# Patient Record
Sex: Female | Born: 1965 | Race: Black or African American | Hispanic: No | Marital: Single | Smoking: Never smoker
Health system: Southern US, Community
[De-identification: ages and names within clinical notes are randomized; demographics above are authoritative.]

## PROBLEM LIST (undated history)

## (undated) DIAGNOSIS — D259 Leiomyoma of uterus, unspecified: Secondary | ICD-10-CM

## (undated) DIAGNOSIS — Z87442 Personal history of urinary calculi: Secondary | ICD-10-CM

## (undated) DIAGNOSIS — K449 Diaphragmatic hernia without obstruction or gangrene: Secondary | ICD-10-CM

## (undated) DIAGNOSIS — K219 Gastro-esophageal reflux disease without esophagitis: Secondary | ICD-10-CM

## (undated) DIAGNOSIS — K5909 Other constipation: Secondary | ICD-10-CM

## (undated) DIAGNOSIS — K5732 Diverticulitis of large intestine without perforation or abscess without bleeding: Secondary | ICD-10-CM

## (undated) DIAGNOSIS — M1990 Unspecified osteoarthritis, unspecified site: Secondary | ICD-10-CM

## (undated) DIAGNOSIS — N920 Excessive and frequent menstruation with regular cycle: Secondary | ICD-10-CM

## (undated) HISTORY — PX: LAPAROSCOPIC CHOLECYSTECTOMY: SUR755

## (undated) HISTORY — PX: APPENDECTOMY: SHX54

## (undated) HISTORY — PX: OTHER SURGICAL HISTORY: SHX169

## (undated) HISTORY — DX: Gastro-esophageal reflux disease without esophagitis: K21.9

---

## 1998-03-16 ENCOUNTER — Emergency Department (HOSPITAL_COMMUNITY): Admission: EM | Admit: 1998-03-16 | Discharge: 1998-03-17 | Payer: Self-pay | Admitting: Emergency Medicine

## 1998-07-20 ENCOUNTER — Observation Stay (HOSPITAL_COMMUNITY): Admission: RE | Admit: 1998-07-20 | Discharge: 1998-07-21 | Payer: Self-pay | Admitting: General Surgery

## 1999-09-05 ENCOUNTER — Emergency Department (HOSPITAL_COMMUNITY): Admission: EM | Admit: 1999-09-05 | Discharge: 1999-09-05 | Payer: Self-pay | Admitting: Emergency Medicine

## 2002-11-23 ENCOUNTER — Emergency Department (HOSPITAL_COMMUNITY): Admission: EM | Admit: 2002-11-23 | Discharge: 2002-11-23 | Payer: Self-pay | Admitting: Emergency Medicine

## 2004-06-05 ENCOUNTER — Emergency Department (HOSPITAL_COMMUNITY): Admission: EM | Admit: 2004-06-05 | Discharge: 2004-06-05 | Payer: Self-pay | Admitting: Neurology

## 2004-06-11 ENCOUNTER — Emergency Department (HOSPITAL_COMMUNITY): Admission: EM | Admit: 2004-06-11 | Discharge: 2004-06-11 | Payer: Self-pay | Admitting: *Deleted

## 2005-09-10 ENCOUNTER — Emergency Department (HOSPITAL_COMMUNITY): Admission: EM | Admit: 2005-09-10 | Discharge: 2005-09-10 | Payer: Self-pay | Admitting: Emergency Medicine

## 2005-11-06 ENCOUNTER — Ambulatory Visit (HOSPITAL_COMMUNITY): Admission: RE | Admit: 2005-11-06 | Discharge: 2005-11-06 | Payer: Self-pay | Admitting: Family Medicine

## 2007-10-23 ENCOUNTER — Ambulatory Visit (HOSPITAL_COMMUNITY): Admission: RE | Admit: 2007-10-23 | Discharge: 2007-10-23 | Payer: Self-pay | Admitting: Family Medicine

## 2007-10-30 ENCOUNTER — Ambulatory Visit: Payer: Self-pay | Admitting: Internal Medicine

## 2007-11-10 ENCOUNTER — Ambulatory Visit: Payer: Self-pay | Admitting: Internal Medicine

## 2007-11-10 ENCOUNTER — Encounter: Payer: Self-pay | Admitting: Internal Medicine

## 2007-11-10 ENCOUNTER — Ambulatory Visit (HOSPITAL_COMMUNITY): Admission: RE | Admit: 2007-11-10 | Discharge: 2007-11-10 | Payer: Self-pay | Admitting: Internal Medicine

## 2007-11-10 ENCOUNTER — Encounter (INDEPENDENT_AMBULATORY_CARE_PROVIDER_SITE_OTHER): Payer: Self-pay | Admitting: *Deleted

## 2007-11-10 HISTORY — PX: ESOPHAGOGASTRODUODENOSCOPY: SHX1529

## 2007-11-25 ENCOUNTER — Ambulatory Visit: Payer: Self-pay | Admitting: Internal Medicine

## 2007-12-22 ENCOUNTER — Ambulatory Visit (HOSPITAL_COMMUNITY): Admission: RE | Admit: 2007-12-22 | Discharge: 2007-12-22 | Payer: Self-pay | Admitting: Internal Medicine

## 2008-01-18 ENCOUNTER — Ambulatory Visit (HOSPITAL_COMMUNITY): Admission: RE | Admit: 2008-01-18 | Discharge: 2008-01-18 | Payer: Self-pay | Admitting: Family Medicine

## 2008-04-21 ENCOUNTER — Ambulatory Visit: Payer: Self-pay | Admitting: Internal Medicine

## 2008-05-09 ENCOUNTER — Ambulatory Visit (HOSPITAL_COMMUNITY): Admission: RE | Admit: 2008-05-09 | Discharge: 2008-05-09 | Payer: Self-pay | Admitting: Internal Medicine

## 2008-05-09 ENCOUNTER — Encounter (INDEPENDENT_AMBULATORY_CARE_PROVIDER_SITE_OTHER): Payer: Self-pay | Admitting: *Deleted

## 2008-05-09 ENCOUNTER — Ambulatory Visit: Payer: Self-pay | Admitting: Internal Medicine

## 2008-08-01 ENCOUNTER — Ambulatory Visit: Payer: Self-pay | Admitting: Internal Medicine

## 2008-12-07 ENCOUNTER — Ambulatory Visit (HOSPITAL_COMMUNITY): Admission: RE | Admit: 2008-12-07 | Discharge: 2008-12-07 | Payer: Self-pay | Admitting: Family Medicine

## 2009-02-28 ENCOUNTER — Telehealth (INDEPENDENT_AMBULATORY_CARE_PROVIDER_SITE_OTHER): Payer: Self-pay | Admitting: *Deleted

## 2009-03-21 ENCOUNTER — Emergency Department (HOSPITAL_COMMUNITY): Admission: EM | Admit: 2009-03-21 | Discharge: 2009-03-21 | Payer: Self-pay | Admitting: Emergency Medicine

## 2009-05-08 ENCOUNTER — Ambulatory Visit (HOSPITAL_COMMUNITY): Admission: RE | Admit: 2009-05-08 | Discharge: 2009-05-08 | Payer: Self-pay | Admitting: Family Medicine

## 2009-05-18 IMAGING — US US ABDOMEN COMPLETE
1 series · 14 of 25 positions shown · non-contrast
Comparison: None.

CLINICAL DATA: Diarrhea, nausea, vomiting, and bloating. 
 ABDOMEN ULTRASOUND:
TECHNIQUE: Complete abdominal ultrasound examination was performed including evaluation of the liver, gallbladder, bile ducts, pancreas, kidneys, spleen, IVC, and abdominal aorta.

[Series 1: unknown · 0.32mm/px · 14 of 56 slices shown]
[im 1/56]
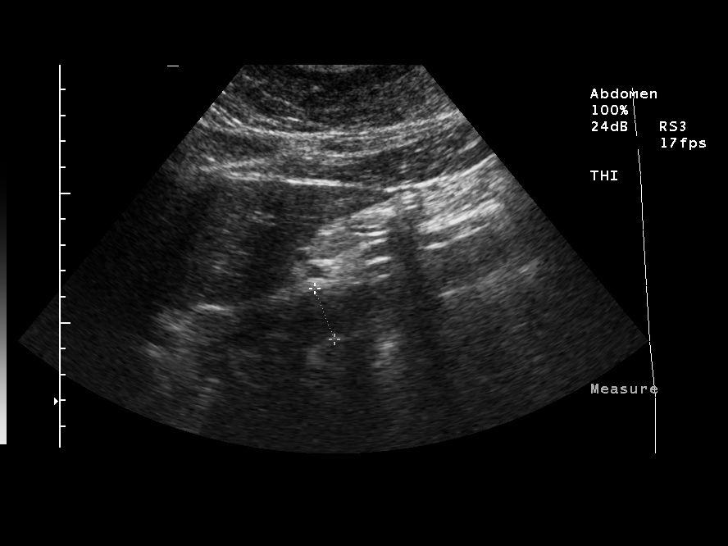
[im 5/56]
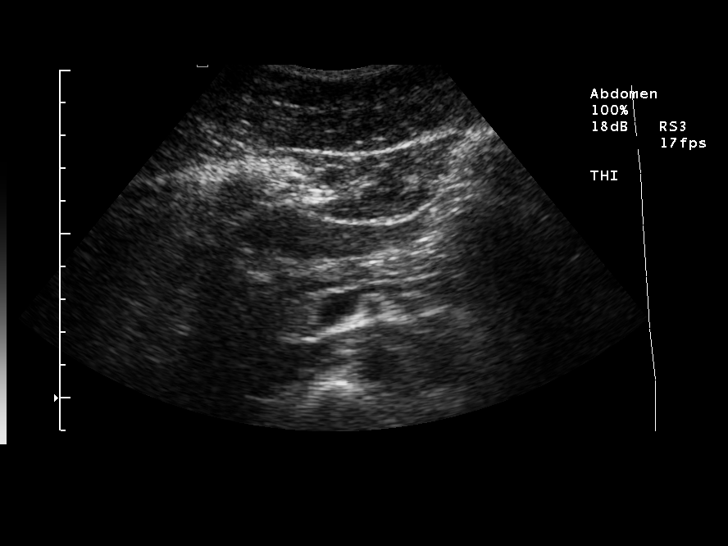
[im 10/56]
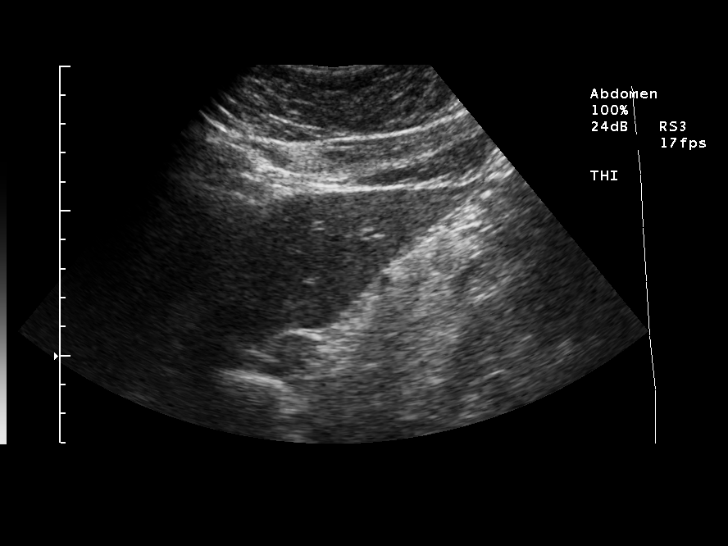
[im 14/56]
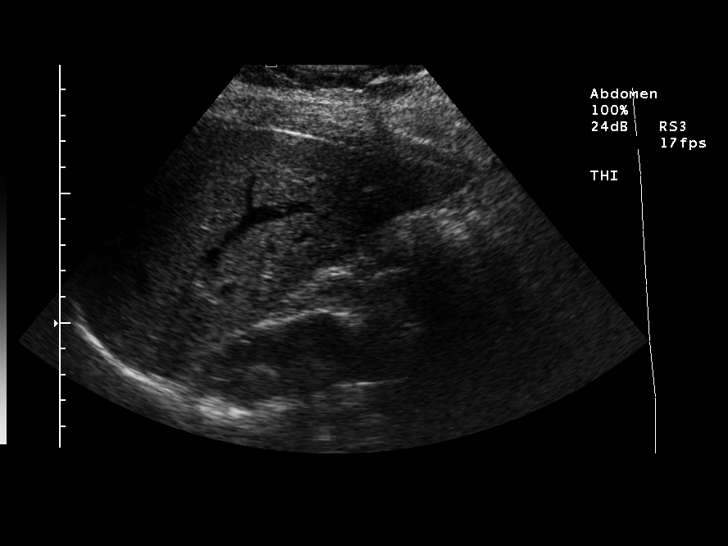
[im 19/56]
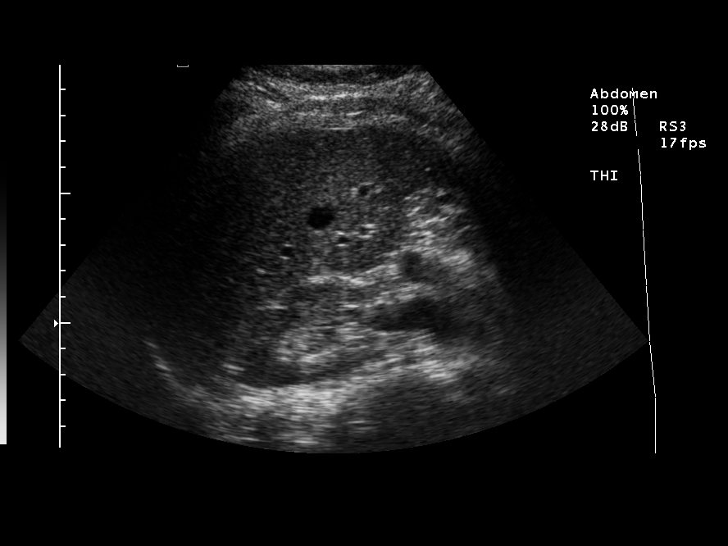
[im 21/56]
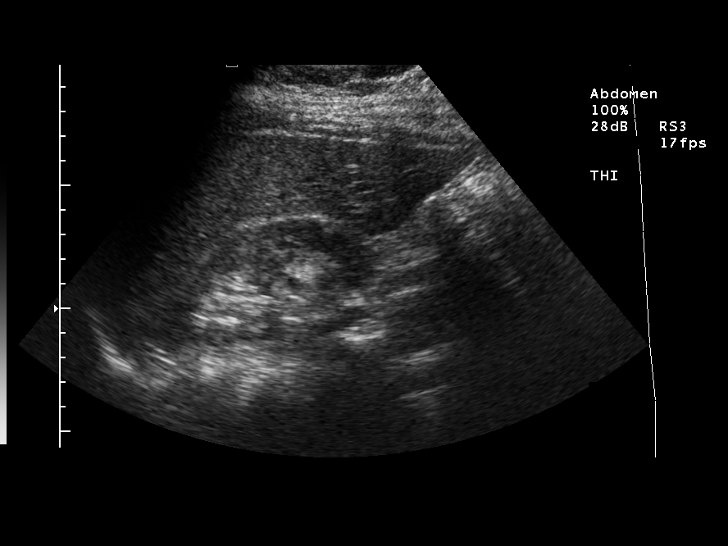
[im 26/56]
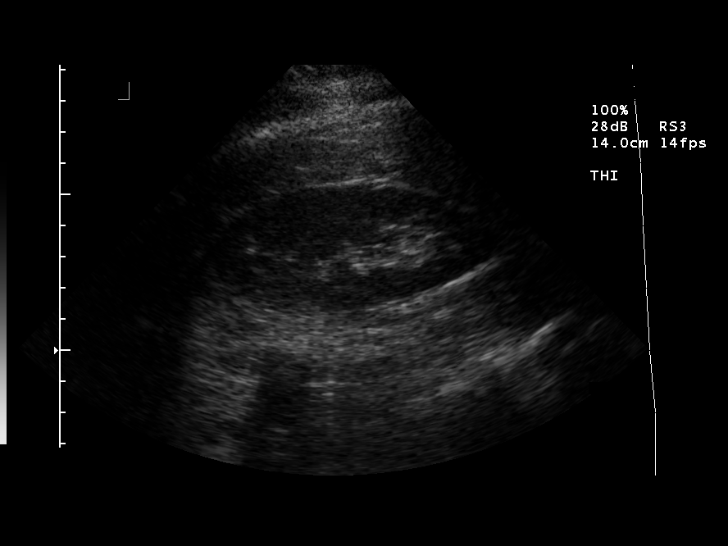
[im 30/56]
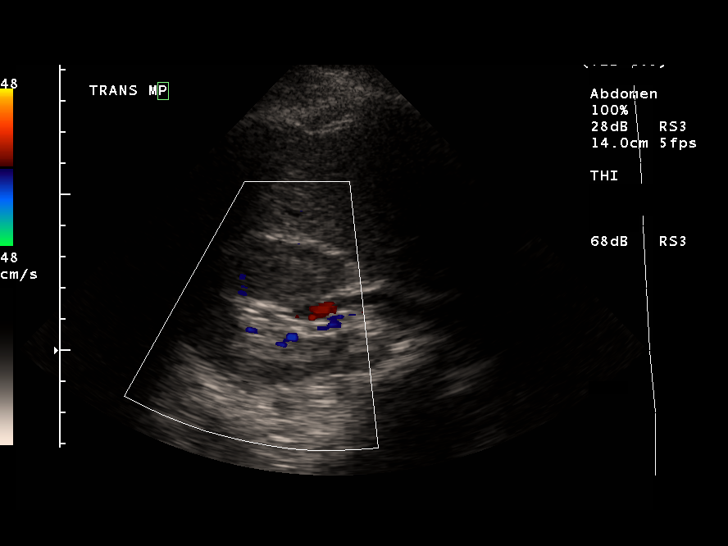
[im 35/56]
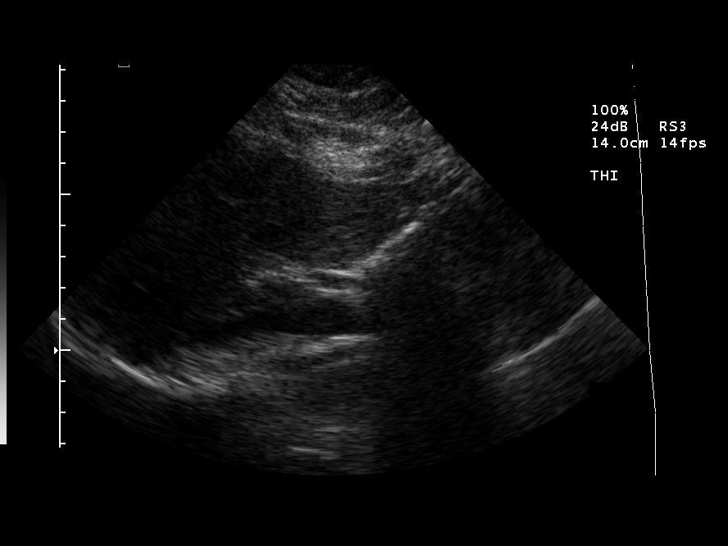
[im 37/56]
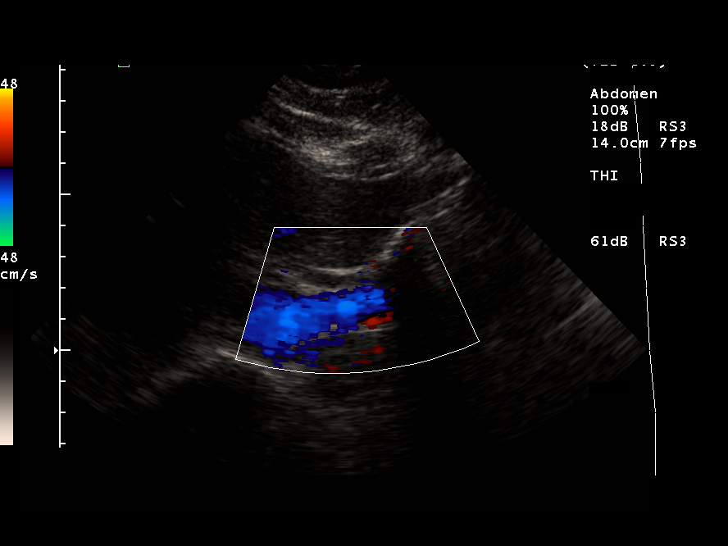
[im 42/56]
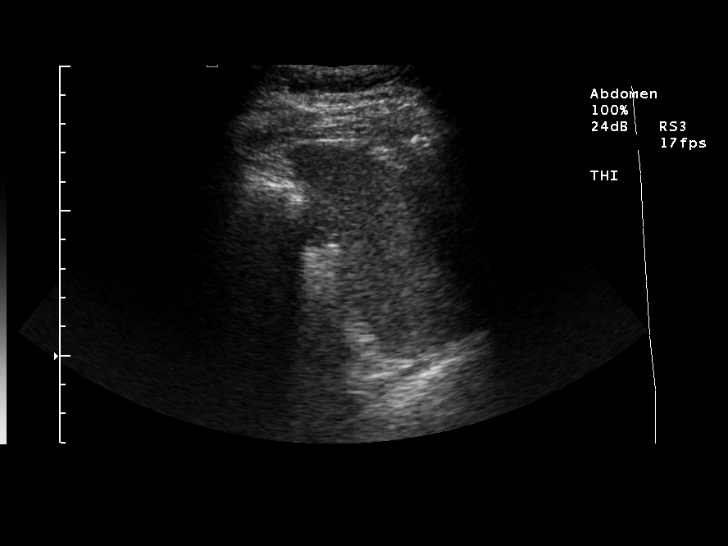
[im 46/56]
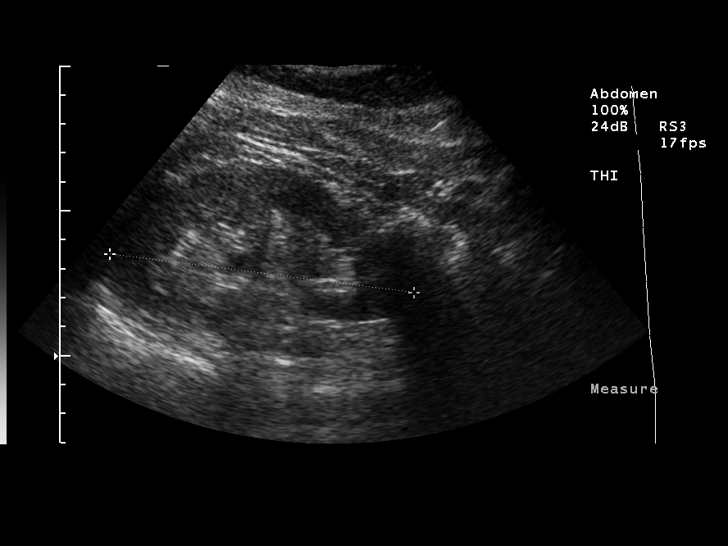
[im 51/56]
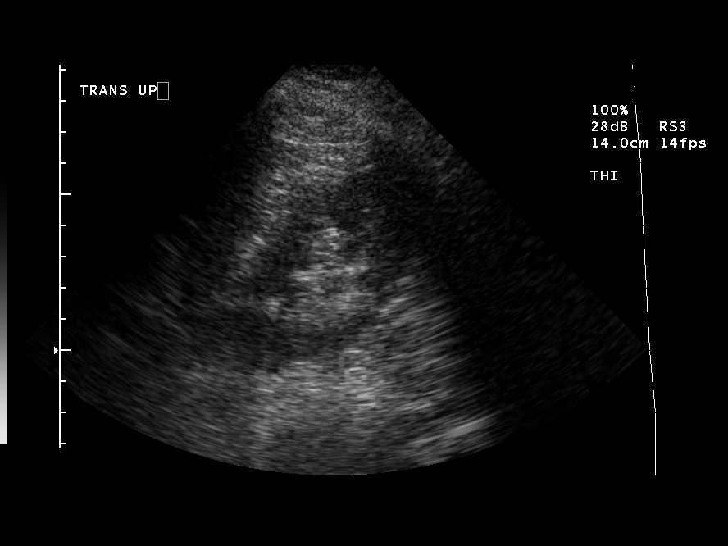
[im 56/56]
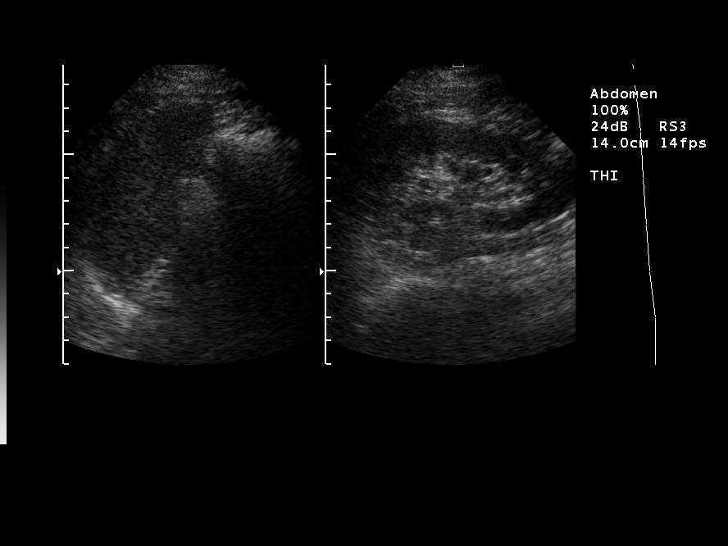

[14 of 25 positions shown; findings below may reference images not displayed]

FINDINGS: The patient is status post cholecystectomy.  Common bile duct is normal at 4.8 mm.  The liver, inferior vena cava and pancreas all appear normal.  Spleen measures 9.1 cm and appears normal.  Right kidney measures 10.7 cm and left kidney measures 10.6 cm.  Both kidneys appear
 normal.   Abdominal aorta is unremarkable.
IMPRESSION: No acute finding with the patient status post cholecystectomy.

## 2009-05-24 ENCOUNTER — Ambulatory Visit (HOSPITAL_COMMUNITY): Admission: RE | Admit: 2009-05-24 | Discharge: 2009-05-24 | Payer: Self-pay | Admitting: Family Medicine

## 2009-07-04 ENCOUNTER — Inpatient Hospital Stay (HOSPITAL_COMMUNITY): Admission: RE | Admit: 2009-07-04 | Discharge: 2009-07-07 | Payer: Self-pay | Admitting: Family Medicine

## 2009-07-04 ENCOUNTER — Encounter (INDEPENDENT_AMBULATORY_CARE_PROVIDER_SITE_OTHER): Payer: Self-pay | Admitting: General Surgery

## 2009-07-04 HISTORY — PX: LAPAROSCOPIC APPENDECTOMY: SUR753

## 2009-07-25 ENCOUNTER — Emergency Department (HOSPITAL_COMMUNITY): Admission: EM | Admit: 2009-07-25 | Discharge: 2009-07-26 | Payer: Self-pay | Admitting: Emergency Medicine

## 2009-11-29 ENCOUNTER — Ambulatory Visit (HOSPITAL_COMMUNITY): Admission: RE | Admit: 2009-11-29 | Discharge: 2009-11-29 | Payer: Self-pay | Admitting: Family Medicine

## 2010-04-30 ENCOUNTER — Ambulatory Visit (HOSPITAL_COMMUNITY): Admission: RE | Admit: 2010-04-30 | Discharge: 2010-04-30 | Payer: Self-pay | Admitting: Urology

## 2010-04-30 HISTORY — PX: EXTRACORPOREAL SHOCK WAVE LITHOTRIPSY: SHX1557

## 2010-09-06 ENCOUNTER — Emergency Department (HOSPITAL_COMMUNITY): Admission: EM | Admit: 2010-09-06 | Discharge: 2010-05-01 | Payer: Self-pay | Admitting: Emergency Medicine

## 2010-09-06 ENCOUNTER — Emergency Department (HOSPITAL_COMMUNITY)
Admission: EM | Admit: 2010-09-06 | Discharge: 2010-09-06 | Payer: Self-pay | Source: Home / Self Care | Admitting: Emergency Medicine

## 2010-10-22 ENCOUNTER — Encounter: Payer: Self-pay | Admitting: Family Medicine

## 2010-12-04 ENCOUNTER — Other Ambulatory Visit (HOSPITAL_COMMUNITY): Payer: Self-pay | Admitting: Internal Medicine

## 2010-12-04 DIAGNOSIS — D219 Benign neoplasm of connective and other soft tissue, unspecified: Secondary | ICD-10-CM

## 2010-12-10 ENCOUNTER — Other Ambulatory Visit (HOSPITAL_COMMUNITY): Payer: Self-pay | Admitting: Internal Medicine

## 2010-12-10 ENCOUNTER — Ambulatory Visit (HOSPITAL_COMMUNITY)
Admission: RE | Admit: 2010-12-10 | Discharge: 2010-12-10 | Disposition: A | Discharge status: Home / Self Care | Payer: 59 | Source: Ambulatory Visit | Attending: Internal Medicine | Admitting: Internal Medicine

## 2010-12-10 DIAGNOSIS — N83209 Unspecified ovarian cyst, unspecified side: Secondary | ICD-10-CM | POA: Insufficient documentation

## 2010-12-10 DIAGNOSIS — N946 Dysmenorrhea, unspecified: Secondary | ICD-10-CM | POA: Insufficient documentation

## 2010-12-10 DIAGNOSIS — D259 Leiomyoma of uterus, unspecified: Secondary | ICD-10-CM | POA: Insufficient documentation

## 2010-12-10 DIAGNOSIS — D219 Benign neoplasm of connective and other soft tissue, unspecified: Secondary | ICD-10-CM

## 2010-12-14 LAB — CBC
MCH: 34.7 pg — ABNORMAL HIGH (ref 26.0–34.0)
MCHC: 34.5 g/dL (ref 30.0–36.0)
MCV: 100.6 fL — ABNORMAL HIGH (ref 78.0–100.0)
Platelets: 195 10*3/uL (ref 150–400)
RBC: 3.72 MIL/uL — ABNORMAL LOW (ref 3.87–5.11)

## 2010-12-14 LAB — URINALYSIS, ROUTINE W REFLEX MICROSCOPIC
Bilirubin Urine: NEGATIVE
Ketones, ur: 15 mg/dL — AB
Leukocytes, UA: NEGATIVE
Nitrite: NEGATIVE
Specific Gravity, Urine: 1.029 (ref 1.005–1.030)
Urobilinogen, UA: 0.2 mg/dL (ref 0.0–1.0)
pH: 5 (ref 5.0–8.0)

## 2010-12-14 LAB — PREGNANCY, URINE: Preg Test, Ur: NEGATIVE

## 2010-12-14 LAB — BASIC METABOLIC PANEL
BUN: 13 mg/dL (ref 6–23)
CO2: 18 mEq/L — ABNORMAL LOW (ref 19–32)
Chloride: 109 mEq/L (ref 96–112)
GFR calc Af Amer: >60 mL/min (ref >60)
GFR calc non Af Amer: >60 mL/min (ref >60)
Potassium: 3.7 mEq/L (ref 3.5–5.1)

## 2010-12-14 LAB — DIFFERENTIAL
Eosinophils Absolute: 0 10*3/uL (ref 0.0–0.7)
Eosinophils Relative: 0 % (ref 0–5)
Lymphs Abs: 0.8 10*3/uL (ref 0.7–4.0)

## 2010-12-14 LAB — URINE CULTURE

## 2011-01-03 LAB — BASIC METABOLIC PANEL
BUN: 11 mg/dL (ref 6–23)
BUN: 9 mg/dL (ref 6–23)
BUN: 9 mg/dL (ref 6–23)
CO2: 25 mEq/L (ref 19–32)
CO2: 29 mEq/L (ref 19–32)
CO2: 31 mEq/L (ref 19–32)
Calcium: 8.6 mg/dL (ref 8.4–10.5)
Calcium: 9.4 mg/dL (ref 8.4–10.5)
Chloride: 106 mEq/L (ref 96–112)
Chloride: 104 mEq/L (ref 96–112)
Creatinine, Ser: 0.71 mg/dL (ref 0.4–1.2)
Creatinine, Ser: 0.77 mg/dL (ref 0.4–1.2)
GFR calc Af Amer: >60 mL/min (ref >60)
GFR calc Af Amer: >60 mL/min (ref >60)
GFR calc non Af Amer: >60 mL/min (ref >60)
Glucose, Bld: 102 mg/dL — ABNORMAL HIGH (ref 70–99)
Glucose, Bld: 116 mg/dL — ABNORMAL HIGH (ref 70–99)
Potassium: 3.2 mEq/L — ABNORMAL LOW (ref 3.5–5.1)
Potassium: 3 mEq/L — ABNORMAL LOW (ref 3.5–5.1)
Potassium: 3.3 mEq/L — ABNORMAL LOW (ref 3.5–5.1)
Sodium: 139 mEq/L (ref 135–145)

## 2011-01-03 LAB — DIFFERENTIAL
Basophils Absolute: 0.1 10*3/uL (ref 0.0–0.1)
Basophils Absolute: 0 10*3/uL (ref 0.0–0.1)
Basophils Relative: 0 % (ref 0–1)
Basophils Relative: 0 % (ref 0–1)
Eosinophils Absolute: 0.5 10*3/uL (ref 0.0–0.7)
Eosinophils Absolute: 0.2 10*3/uL (ref 0.0–0.7)
Eosinophils Relative: 5 % (ref 0–5)
Eosinophils Relative: 0 % (ref 0–5)
Eosinophils Relative: 2 % (ref 0–5)
Lymphocytes Relative: 14 % (ref 12–46)
Lymphs Abs: 3.2 10*3/uL (ref 0.7–4.0)
Lymphs Abs: 1.9 10*3/uL (ref 0.7–4.0)
Lymphs Abs: 2.7 10*3/uL (ref 0.7–4.0)
Monocytes Absolute: 1.1 10*3/uL — ABNORMAL HIGH (ref 0.1–1.0)
Monocytes Relative: 15 % — ABNORMAL HIGH (ref 3–12)
Monocytes Relative: 9 % (ref 3–12)
Monocytes Relative: 9 % (ref 3–12)
Neutro Abs: 10 10*3/uL — ABNORMAL HIGH (ref 1.7–7.7)
Neutro Abs: 10.2 10*3/uL — ABNORMAL HIGH (ref 1.7–7.7)
Neutrophils Relative %: 54 % (ref 43–77)
Neutrophils Relative %: 76 % (ref 43–77)

## 2011-01-03 LAB — URINE MICROSCOPIC-ADD ON

## 2011-01-03 LAB — CBC
HCT: 33.7 % — ABNORMAL LOW (ref 36.0–46.0)
HCT: 29.8 % — ABNORMAL LOW (ref 36.0–46.0)
HCT: 30.5 % — ABNORMAL LOW (ref 36.0–46.0)
Hemoglobin: 10.8 g/dL — ABNORMAL LOW (ref 12.0–15.0)
MCHC: 34.4 g/dL (ref 30.0–36.0)
MCHC: 34.5 g/dL (ref 30.0–36.0)
MCV: 98.8 fL (ref 78.0–100.0)
MCV: 100.7 fL — ABNORMAL HIGH (ref 78.0–100.0)
Platelets: 243 10*3/uL (ref 150–400)
Platelets: 217 10*3/uL (ref 150–400)
Platelets: 236 10*3/uL (ref 150–400)
RBC: 3.04 MIL/uL — ABNORMAL LOW (ref 3.87–5.11)
RBC: 3.89 MIL/uL (ref 3.87–5.11)
RDW: 12.9 % (ref 11.5–15.5)
WBC: 13.3 10*3/uL — ABNORMAL HIGH (ref 4.0–10.5)

## 2011-01-03 LAB — URINALYSIS, ROUTINE W REFLEX MICROSCOPIC
Glucose, UA: NEGATIVE mg/dL
Ketones, ur: NEGATIVE mg/dL
Leukocytes, UA: NEGATIVE
Protein, ur: NEGATIVE mg/dL

## 2011-01-03 LAB — GC/CHLAMYDIA PROBE AMP, GENITAL: GC Probe Amp, Genital: NEGATIVE

## 2011-01-03 LAB — RPR: RPR Ser Ql: NONREACTIVE

## 2011-01-03 LAB — WET PREP, GENITAL: Yeast Wet Prep HPF POC: NONE SEEN

## 2011-02-12 NOTE — Assessment & Plan Note (Signed)
NAMEMarland Kitchen  DALAYA, SUPPA                 CHART#:  [id number]   DATE:  08/01/2008                       DOB:  [birth year]   CHIEF COMPLAINT:  Followup of procedure.   SUBJECTIVE:  The patient is here to follow up on colonoscopy done back  in August 2009 for abdominal bloating, one episode of hematochezia, and  family history of colon polyps.  She had a friable anal canal and  pancolonic diverticula.  Otherwise, colonic mucosa and terminal ileum  mucosa appeared normal.  She presents today, stating that overall she  has been doing fairly well.  She generally has a bowel movement 1-2  times daily.  Occasionally, she has some spasms related to it, but the  pain goes away after having a bowel movement.  She has had some  intermittent heartburn, if she does not take the Prilosec.  She  generally takes it most days.  Denies any dysphagia or odynophagia.  Last week, she had some abdominal bloating, some diarrhea, nausea, but  they went away.  She notes that if she eats several small meals during  the day, she feels a lot better.   CURRENT MEDICATIONS:  See updated list.   ALLERGIES:  No known drug allergies.   PHYSICAL EXAMINATION:  VITAL SIGNS:  Weight 191, height 5 feet 2 inches,  temp 98.2, blood pressure 110/78, and pulse 68.  GENERAL:  Pleasant, obese, white female in no acute distress.  SKIN:  Warm and dry and no jaundice.  HEENT:  Sclerae nonicteric.  Oropharyngeal mucosa moist and pink.  No  lesions, erythema, or exudate.  No lymphadenopathy or thyromegaly.  ABDOMEN:  Positive bowel sounds.  Abdomen is soft, obese, but  symmetrical and nontender.  No organomegaly or masses.  No abdominal  bruits or hernia.  No rebound tenderness or guarding.  LOWER EXTREMITIES:  No edema.   IMPRESSION:  The patient is a 45 year old lady with history of chronic  gastroesophageal reflux disease, nonulcerative dyspepsia, probable  irritable bowel syndrome who has been doing reasonably well in the last  few months.  She has intermittent heartburn if she does not take the  Prilosec.   PLAN:  1. She will continue Prilosec 20 mg OTC daily as needed, #20 samples.  2. Continue Advantage IBS 1 daily, #20 samples provided.  3. Office visit p.r.n.       Tana Coast, P.A.  Electronically Signed     R. Roetta Sessions, M.D.  Electronically Signed    LL/MEDQ  D:  08/01/2008  T:  08/01/2008  Job:  [id number]   cc:   Kirk Ruths, M.D.

## 2011-02-12 NOTE — Consult Note (Signed)
NAME:  Lauren Glover, Lauren Glover                ACCOUNT NO.:  [id number]   MEDICAL RECORD NO.:  [id number]          PATIENT TYPE:  AMB   LOCATION:  DAY                           FACILITY:  [facility]   PHYSICIAN:  R. Roetta Sessions, M.D. DATE OF BIRTH:  02/05/1966   DATE OF CONSULTATION:  10/30/2007  DATE OF DISCHARGE:                                 CONSULTATION   REASON FOR CONSULTATION:  Abdominal pain, abdominal bloating for two  months.   PRIMARY CARE PHYSICIAN:  Kirk Ruths, M.D.   HISTORY OF PRESENT ILLNESS:  Patient is a 45 year old African-American  female, who gives a two-month history of frequent epigastric discomfort  and abdominal bloating.  She was seen in the emergency department at  [facility] on January 27.  Acute abdominal series  revealed a nonspecific bowel gas pattern.  Otherwise, abdomen was  unremarkable.  She is status post cholecystectomy.  She had a normal  hemoglobin of 13.1, MCV was slightly elevated at 98.3, normal being 80-  98. White count normal, platelet count normal, LFTs normal, lipase  normal, BUN and creatinine normal.  She also had an abdominal ultrasound  on October 23, 2007, which was negative, status post cholecystectomy.  She has been consuming quite a bit of Rolaids for refractory GERD.  She  states she has postprandial epigastric discomfort and bloating,  swelling.  This occurs with almost anything she eats, but is also  aggravated by beef.  She has postprandial nausea, but no vomiting.  Her  bowel movements have been unchanged, generally two to three a day.  When  she was seen in the ED, they gave her magnesium citrate and Lactulose.  She states she was told she had a blockage.  She continues to take  Lactulose at this time, but really has not had diarrhea.  She denies any  blood in the stool.  Her rectal exam was unremarkable, done in the ED,  and she was heme-negative.  Denies any weight-loss.   CURRENT MEDICATIONS:  1. Lactulose  15 mL b.i.d.  2. She just completed Augmentin, which was given for ear infection.  3. Advil p.r.n.  4. Rolaids p.r.n.   ALLERGIES:  No known drug allergies.   PAST MEDICAL HISTORY:  Negative for chronic illnesses.   PAST SURGICAL HISTORY:  Status post cholecystectomy for cholelithiasis  in 1998.  She had a benign tumor removed from her left calf.   FAMILY HISTORY:  Mother has history of diverticulitis, father deceased  at age 51, history of throat cancer.  No family history of colorectal  cancer or peptic ulcer disease.   SOCIAL HISTORY:  She is single, without children.  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  She has  never been a smoker, occasionally consumes alcohol.   REVIEW OF SYSTEMS:  See HPI for GI.  CONSTITUTIONAL:  No weight-loss.  CARDIOPULMONARY:  No chest pain, shortness of breath.   PHYSICAL EXAM:  Weight 189, height 5 feet 2, temperature 98, blood  pressure 118/86, pulse 60.  GENERAL:  Pleasant, obese,  African-American female, in no acute  distress.  SKIN:  Warm and dry, no jaundice.  HEENT:  Sclerae anicteric.  Oropharyngeal mucosa moist and pink, no  lesions, erythema or exudate, no lymphadenopathy or thyromegaly.  CHEST:  Lungs are clear to auscultation.  CARDIAC EXAM:  Reveals regular rate and rhythm, normal S1, S2, no  murmurs, rubs or gallops.  ABDOMEN:  Positive bowel sounds, obese, symmetrical, soft.  She has mild  epigastric tenderness to deep palpation, no rebound or guarding, no  organomegaly or masses, no abdominal bruits or hernias.  LOWER EXTREMITIES:  No edema.   IMPRESSION:  Patient is a 45 year old lady with two-month history of  intermittent postprandial epigastric discomfort, abdominal bloating and  fullness and refractory GERD.  She has been consuming OTC antacids  without relief.  Abdominal ultrasound unremarkable.  Labs unremarkable,  as well.  She has not been given a trial of proton pump  inhibitor  therapy.  She is interested in pursuing upper endoscopy to further  evaluation her symptoms, which is reasonable.  I did offer her a trial  of proton pump inhibitor therapy prior to deciding about upper  endoscopy, but she wished to proceed at this time.  I discussed the  risks, alternatives, benefits with the patient and she is agreeable to  proceed.   PLAN:  1. Esophagogastroduodenoscopy in the near future with Dr. Jena Gauss.  2. We will begin Aciphex 20 mg daily #20 samples provided.      Lauren Glover, P.AJonathon Bellows, M.D.  Electronically Signed    LL/MEDQ  D:  10/30/2007  T:  10/30/2007  Job:  [id number]   cc:   Kirk Ruths, M.D.  Fax: [phone]

## 2011-02-12 NOTE — Op Note (Signed)
NAME:  Lauren Glover, Lauren Glover                ACCOUNT NO.:  [id number]   MEDICAL RECORD NO.:  [id number]          PATIENT TYPE:  AMB   LOCATION:  DAY                           FACILITY:  [facility]   PHYSICIAN:  R. Roetta Sessions, M.D. DATE OF BIRTH:  10/02/1965   DATE OF PROCEDURE:  11/10/2007  DATE OF DISCHARGE:                               OPERATIVE REPORT   PROCEDURE:  Esophagogastroduodenoscopy with biopsy.   INDICATIONS FOR PROCEDURE:  45 year old lady with two month history of  intermittent postprandial epigastric pain, bloating in setting of  constipation having refractory symptoms consistent with indigestion but  no odynophagia, no dysphagia.  She was on OTC antacids.  She started  AcipHex 20 mg orally daily two weeks ago.  Reflux symptoms improved but  bloating, constipation have not.  She has been on some lactulose which  the ED doctor gave her but it really has not helped.  Has not had any  melena or rectal bleeding.  Gallbladder is out.  LFTs amylase, lipase  previously determined to be normal.  EGD is now being done.  This  approach has been discussed with the patient at length.  Potential  risks, benefits and alternatives have been reviewed, questions answered.  She is agreeable.  Please see documentation in the medical record.   PROCEDURE NOTE:  O2 saturation, blood pressure, pulse and respirations  were monitored throughout the entire procedure.  Conscious sedation  Versed 4 mg IV Demerol 75 mg IV in divided doses.  Instrument Pentax  video chip system.  Cetacaine spray for topical pharyngeal anesthesia.   FINDINGS:  EGD examination tubular esophagus revealed no mucosal  abnormalities.  EG junction easily traversed.  Stomach:  Was emptied, insufflated well with air.  Thorough examination  of gastric mucosa including retroflexion view proximal stomach,  esophagogastric junction demonstrated a 4-mm nodule up in the cardia  just distal to the EG junction.  Please see photos.  Appeared  to be a  benign lesion, otherwise gastric mucosa appeared normal.  Pylorus  patent, easily traversed.  Examination of bulb, second portion revealed  no abnormalities.   THERAPEUTIC/DIAGNOSTIC MANEUVERS PERFORMED:  The polypoid lesion in the  cardia was biopsied and essentially removed with this maneuver, cold  biopsy removed.  The patient tolerated the procedure well as reacted in  endoscopy.   IMPRESSION:  Normal esophagus, hiatal hernia.  Small polypoid lesion in  the cardia of uncertain significance, status post biopsy removal.  Otherwise normal stomach, patent pylorus, normal D1, D2.   RECOMMENDATIONS:  1. Continue AcipHex 20 mg orally daily.  2. Stop Lactulose, begin MiraLax 17 grams orally at bedtime.  She is      taken nightly p.r.n. constipation.  Lactulose may be causing some      of her bloating.  Plan to see this nice lady back in 1 month.  3. Follow-up on path.      Jonathon Bellows, M.D.  Electronically Signed     RMR/MEDQ  D:  11/10/2007  T:  11/11/2007  Job:  [id number]

## 2011-02-12 NOTE — H&P (Signed)
NAME:  DEMETRI, KERMAN                ACCOUNT NO.:  [id number]   MEDICAL RECORD NO.:  [id number]          PATIENT TYPE:  AMB   LOCATION:  DAY                           FACILITY:  [facility]   PHYSICIAN:  R. Roetta Sessions, M.D. DATE OF BIRTH:  09-22-1966   DATE OF ADMISSION:  DATE OF DISCHARGE:  LH                              HISTORY & PHYSICAL   CHIEF COMPLAINT:  Abdominal pain, change in bowel movements.   HISTORY OF PRESENT ILLNESS:  Bhavika is a pleasant 45 year old African  American female who presents today in followup.  We last saw her on  February 2009.  She has a history of intermittent postprandial  epigastric bloating and fullness as well as refractory GERD.  Prior EGD  in February 2009 revealed a hiatal hernia and a small polypoid lesion in  the cardia which was benign fundic gland polyp on pathology.  H. pylori  was negative on biopsy.  She has also had abdominal ultrasound which was  unremarkable status post cholecystectomy.  The patient states that she  continues to have epigastric bloating and discomfort.  It is worse when  she eats.  She has intermittent nausea but no vomiting.  She states she  feels full in the epigastrium after meals.  She complains of dysphagia  to solid foods and also refractory heartburn, especially nocturnally.  She did well previously on Nexium but her insurance company would not  cover.  Aciphex did not help and currently she is on omeprazole 20 mg  daily with breakthrough symptoms.  She states that her bowel movements  have changed somewhat.  She is starting to have problems with rectal  pains, increased abdominal pain with bowel movements, and a sensation of  that she never completely evacuates her rectum.  She states she feels  like to have a bowel movement at all times.  She has smaller stool  amounts that are coming out.  She complains of a trapped gas.  She takes  MiraLax on a p.r.n. basis, usually about 1-2 times a week.  She  generally passes a  very small amount of stool on a daily basis.  She has  had a single episode of hematochezia.  She has had some cramping with  right-sided abdominal pain.  She has never had a colonoscopy.  She is  concerned that her mother and her sister both have history of colon  polyps and her sister is 13 years old.   CURRENT MEDICATIONS:  1. Advil p.r.n.  2. Rolaids p.r.n.  3. MiraLax p.r.n.  4. Omeprazole 20 mg daily.  5. Pepto-Bismol p.r.n.   ALLERGIES:  No known drug allergies.   PAST MEDICAL HISTORY:  Refractory GERD status post EGD as outlined  above, otherwise negative for chronic illnesses.   PAST SURGICAL HISTORY:  Cholecystectomy for cholelithiasis in 1998 and a  benign tumor removed from her left calf.   FAMILY HISTORY:  Mother had diverticulitis and history of colon polyps.  Father deceased at age 29 with throat cancer.  Sister with history of  colon polyps at age 37.  No family  history of colorectal cancer or  peptic ulcer disease.   SOCIAL HISTORY:  She is single.  No children.  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  She has never  been a smoker, occasionally consumes alcohol.   REVIEW OF SYSTEMS:  See HPI for GI.  CONSTITUTIONAL:  No weight loss.  CARDIOPULMONARY:  No chest pain or shortness of breath.  GENITOURINARY:  No dysuria or hematuria.   PHYSICAL EXAMINATION:  VITAL SIGNS:  Weight 194 and stable, height 5  feet 2 inches, temperature 98.1, blood pressure 110/78, and pulse 68.  GENERAL:  Pleasant, obese black female in no acute distress.  SKIN:  Warm and dry.  No jaundice.  HEENT:  Sclerae nonicteric.  Oropharyngeal mucosa moist and pink.  No  lesions, erythema, or exudate.  No lymphadenopathy or thyromegaly.  CHEST:  Lungs clear to auscultation.  CARDIAC:  Regular rate and rhythm.  Normal S1 and S2.  No murmurs, rubs,  or gallops.  ABDOMEN:  Positive bowel sounds.  Abdomen is obese.  She has mild  epigastric tenderness to  deep palpation.  No rebound or guarding.  No  organomegaly or masses.  No abdominal bruits or hernias.  LOWER EXTREMITIES:  No edema.  RECTAL:  No external lesions, no masses in the rectal vault.  Brown  secretions that are heme-negative.  No apparent pain with examination.   IMPRESSION:  Ayrabella is a 45 year old lady with ongoing intermittent  postprandial epigastric discomfort, abdominal bloating and fullness, and  refractory gastroesophageal reflux disease.  Esophagogastroduodenoscopy  as outlined above.  Now, she presents with change in bowel movements and  rectal pain.  Rectal exam unremarkable.  She has had a single episode of  hematochezia and family history of colon polyps, although pathology not  available to [place].  At this point in time, we would offer colonoscopy for  further evaluation of her symptoms.  With regards to refractory  gastroesophageal reflux disease, we would like to increase her  omeprazole to 20 mg b.i.d. and see if she has any improvement as well as  any change with her mild dysphagia.   PLAN:  1. Colonoscopy in near future with Dr. Jena Gauss.  2. Increase omeprazole to 20 mg b.i.d. 30 minutes before meals.  I      supplemented her prescription with samples      of Prilosec OTC #20.  3. Irritable bowel syndrome.  Advantage Probiotic one daily, 3-week      supply given.  4. For future notice, her insurance plan covers Protonix and Zegerid      at year 2, which the patient has not tried.      Tana Coast, P.AJonathon Bellows, M.D.  Electronically Signed    LL/MEDQ  D:  04/21/2008  T:  04/22/2008  Job:  [id number]   cc:   Kirk Ruths, M.D.  Fax: [phone]

## 2011-02-12 NOTE — Op Note (Signed)
NAME:  Lauren Glover, Lauren Glover                ACCOUNT NO.:  [id number]   MEDICAL RECORD NO.:  [id number]          PATIENT TYPE:  AMB   LOCATION:  DAY                           FACILITY:  [facility]   PHYSICIAN:  R. Roetta Sessions, M.D. DATE OF BIRTH:  [birth year]   DATE OF PROCEDURE:  DATE OF DISCHARGE:                               OPERATIVE REPORT   INDICATIONS FOR PROCEDURE:  A 45 year old lady with intermittent  abdominal bloating and fullness, single episode of hematochezia, and  positive family history of colonic polyps, comes for colonoscopy.  Risks, benefits, alternatives, and limitations have been reviewed  previously and again at the bedside.  Questions answered and all parties  agreeable.  Please see the documentation in the medical record.   PROCEDURE NOTE:  O2 saturation, blood pressure, and pulse of the patient  monitored throughout the entire procedure.  Conscious sedation, Versed 3  mg IV and Demerol 75 g IV in divided doses.   INSTRUMENT:  Pentax video chip system.   FINDINGS:  Digital rectal exam revealed no abnormalities.  Endoscopic  findings:  The prep was adequate.  Colon:  Colonic mucosa was surveyed  from the rectosigmoid junction through the left transverse, right colon,  appendiceal orifice, ileocecal valve, and cecum.  These structures were  well seen and photographed for the record.  Terminal ileum was intubated  to 10 cm from this level.  Scope was slowly withdrawn. All previously  mentioned mucosal surfaces were again seen.  The patient was noted to  have pancolonic diverticula, otherwise, colonic mucosa appeared normal  as did the terminal ileal mucosa.  The scope was pulled down to the  rectum.  The rectal vault was small and was unable to retroflex.  For  the same reason I was able to see the rectal mucosa on FOS very well.  It appeared normal.  The anal canal was friable.  The patient tolerated  procedure well and was reacted in Endoscopy.   IMPRESSION:  Friable  anal canal, otherwise, normal rectum and pancolonic  diverticula.  Remainder of colonic mucosa and terminal ileal mucosa  appeared normal.   RECOMMENDATIONS:  1. Diverticulosis literature provided to Ms. Groleau.  2. Daily Fibersure or Benefiber fiber supplement.  3. A 10-day course of Anusol-HC suppository one per rectum at bedtime.  4. Followup appointment with [place] in 3 months.  5. Given family history of colonic polyps, we recommend a repeat      colonoscopy in 5 years.      Jonathon Bellows, M.D.  Electronically Signed     RMR/MEDQ  D:  05/09/2008  T:  05/10/2008  Job:  [id number]   cc:   Kirk Ruths, M.D.  Fax: [phone]

## 2011-02-12 NOTE — Assessment & Plan Note (Signed)
NAMEMarland Kitchen  Lauren Glover, [employer]                 CHART#:  [id number]   DATE:  11/25/2007                       DOB:  1966-03-30   CHIEF COMPLAINT:  Followup from procedure.   SUBJECTIVE:  The patient is here for a followup visit.  She underwent an  EGD on 11/10/2007 to further evaluate post prandial epigastric pain and  bloating and refractory indigestion.  She had a hiatal hernia and a  small polypoid lesion in the cardia which turned out to be benign fundic  gland polyp.  There was no H. pylori seen on the stains.  She says that  her reflux is better.  Overall, she does feel some improvement but  continues to have some post prandial epigastric bloating.  She says it  occurs for several hours after meals and then resolves.  She has had  some nausea last week but really nothing significant and no vomiting.  She is having bowel movements one and two times daily.  She is taking  MiraLax on a regular basis.  She denies any heartburn.   CURRENT MEDICATIONS:  See the updated list.   ALLERGIES:  No known drug allergies.   PHYSICAL EXAMINATION:  VITAL SIGNS:  Weight 192, up 3 pounds, temp 98.2,  blood pressure 110/78, pulse 60.  GENERAL:  A pleasant, obese, African American female in no acute  distress.  SKIN:  Warm and dry.  No jaundice.  ABDOMEN:  Positive bowel sounds.  Obese but symmetrical.  Abdomen is  very soft, no tenderness.  No rebound or guarding.  No organomegaly or  masses.   IMPRESSION:  The patient is a 44 year old lady with a 2-month history of  intermittent post prandial epigastric bloating and fullness.  Her  refractory gastroesophageal reflux disease has improved on Aciphex.  She  continues to have some vague bloating type symptoms.  Constipation is  better as well.  Her symptoms are more or less in the realm of that of  dyspepsia at this point.  I do not suspect that she has significant  gastroparesis.  Recent esophagogastroduodenoscopy unremarkable.   PLAN:  Trial of increase  in her Aciphex to 20 mg b.i.d.  She will call  in 10 days with a progress report.       Tana Coast, P.A.  Electronically Signed     R. Roetta Sessions, M.D.  Electronically Signed    LL/MEDQ  D:  11/26/2007  T:  11/26/2007  Job:  [id number]

## 2011-03-17 ENCOUNTER — Emergency Department (HOSPITAL_COMMUNITY)
Admission: EM | Admit: 2011-03-17 | Discharge: 2011-03-17 | Disposition: A | Discharge status: Home / Self Care | Payer: 59 | Attending: Emergency Medicine | Admitting: Emergency Medicine

## 2011-03-17 ENCOUNTER — Emergency Department (HOSPITAL_COMMUNITY): Payer: 59

## 2011-03-17 DIAGNOSIS — J45909 Unspecified asthma, uncomplicated: Secondary | ICD-10-CM | POA: Insufficient documentation

## 2011-03-17 DIAGNOSIS — J069 Acute upper respiratory infection, unspecified: Secondary | ICD-10-CM | POA: Insufficient documentation

## 2011-03-17 DIAGNOSIS — Z79899 Other long term (current) drug therapy: Secondary | ICD-10-CM | POA: Insufficient documentation

## 2011-04-19 ENCOUNTER — Ambulatory Visit (HOSPITAL_COMMUNITY)
Admission: RE | Admit: 2011-04-19 | Discharge: 2011-04-19 | Disposition: A | Discharge status: Home / Self Care | Payer: 59 | Source: Ambulatory Visit | Attending: Family Medicine | Admitting: Family Medicine

## 2011-04-19 ENCOUNTER — Other Ambulatory Visit (HOSPITAL_COMMUNITY): Payer: Self-pay | Admitting: Family Medicine

## 2011-04-19 DIAGNOSIS — R319 Hematuria, unspecified: Secondary | ICD-10-CM | POA: Insufficient documentation

## 2011-04-19 DIAGNOSIS — R1032 Left lower quadrant pain: Secondary | ICD-10-CM | POA: Insufficient documentation

## 2011-04-19 DIAGNOSIS — R109 Unspecified abdominal pain: Secondary | ICD-10-CM

## 2011-07-08 ENCOUNTER — Ambulatory Visit: Payer: 59 | Admitting: Gastroenterology

## 2011-07-08 ENCOUNTER — Telehealth: Payer: Self-pay | Admitting: Gastroenterology

## 2011-07-08 NOTE — Telephone Encounter (Signed)
Pt was a no show

## 2011-09-04 NOTE — Telephone Encounter (Signed)
Needs f/u visit

## 2011-09-10 ENCOUNTER — Encounter: Payer: Self-pay | Admitting: Internal Medicine

## 2011-09-10 NOTE — Telephone Encounter (Signed)
Mailed letter for patient to call office to [facility]

## 2011-11-20 ENCOUNTER — Encounter: Payer: Self-pay | Admitting: Internal Medicine

## 2011-11-21 ENCOUNTER — Ambulatory Visit: Payer: 59 | Admitting: Gastroenterology

## 2012-01-12 ENCOUNTER — Encounter (HOSPITAL_COMMUNITY): Payer: Self-pay | Admitting: *Deleted

## 2012-01-12 ENCOUNTER — Emergency Department (HOSPITAL_COMMUNITY): Payer: 59

## 2012-01-12 ENCOUNTER — Emergency Department (HOSPITAL_COMMUNITY)
Admission: EM | Admit: 2012-01-12 | Discharge: 2012-01-12 | Disposition: A | Discharge status: Home / Self Care | Payer: 59 | Attending: Emergency Medicine | Admitting: Emergency Medicine

## 2012-01-12 DIAGNOSIS — M542 Cervicalgia: Secondary | ICD-10-CM | POA: Insufficient documentation

## 2012-01-12 DIAGNOSIS — M25519 Pain in unspecified shoulder: Secondary | ICD-10-CM | POA: Insufficient documentation

## 2012-01-12 HISTORY — DX: Diverticulitis of large intestine without perforation or abscess without bleeding: K57.32

## 2012-01-12 MED ORDER — IBUPROFEN 800 MG PO TABS
800.0000 mg | ORAL_TABLET | Freq: Once | ORAL | Status: AC
  Administered 2012-01-12: 800 mg via ORAL
  Filled 2012-01-12: qty 1

## 2012-01-12 MED ORDER — IBUPROFEN 800 MG PO TABS: 800.0000 mg | ORAL_TABLET | Freq: Three times a day (TID) | ORAL | Status: AC

## 2012-01-12 MED ORDER — DIAZEPAM 5 MG PO TABS: 5.0000 mg | ORAL_TABLET | Freq: Two times a day (BID) | ORAL | Status: AC

## 2012-01-12 NOTE — Discharge Instructions (Signed)
Arthralgia Your caregiver has diagnosed you as suffering from an arthralgia. Arthralgia means there is pain in a joint. This can come from many reasons including:  Bruising the joint which causes soreness (inflammation) in the joint.   Wear and tear on the joints which occur as we grow older (osteoarthritis).   Overusing the joint.   Various forms of arthritis.   Infections of the joint.  Regardless of the cause of pain in your joint, most of these different pains respond to anti-inflammatory drugs and rest. The exception to this is when a joint is infected, and these cases are treated with antibiotics, if it is a bacterial infection. HOME CARE INSTRUCTIONS   Rest the injured area for as long as directed by your caregiver. Then slowly start using the joint as directed by your caregiver and as the pain allows. Crutches as directed may be useful if the ankles, knees or hips are involved. If the knee was splinted or casted, continue use and care as directed. If an stretchy or elastic wrapping bandage has been applied today, it should be removed and re-applied every 3 to 4 hours. It should not be applied tightly, but firmly enough to keep swelling down. Watch toes and feet for swelling, bluish discoloration, coldness, numbness or excessive pain. If any of these problems (symptoms) occur, remove the ace bandage and re-apply more loosely. If these symptoms persist, contact your caregiver or return to this location.   For the first 24 hours, keep the injured extremity elevated on pillows while lying down.   Apply ice for 15 to 20 minutes to the sore joint every couple hours while awake for the first half day. Then 3 to 4 times per day for the first 48 hours. Put the ice in a plastic bag and place a towel between the bag of ice and your skin.   Wear any splinting, casting, elastic bandage applications, or slings as instructed.   Only take over-the-counter or prescription medicines for pain,  discomfort, or fever as directed by your caregiver. Do not use aspirin immediately after the injury unless instructed by your physician. Aspirin can cause increased bleeding and bruising of the tissues.   If you were given crutches, continue to use them as instructed and do not resume weight bearing on the sore joint until instructed.  Persistent pain and inability to use the sore joint as directed for more than 2 to 3 days are warning signs indicating that you should see a caregiver for a follow-up visit as soon as possible. Initially, a hairline fracture (break in bone) may not be evident on X-rays. Persistent pain and swelling indicate that further evaluation, non-weight bearing or use of the joint (use of crutches or slings as instructed), or further X-rays are indicated. X-rays may sometimes not show a small fracture until a week or 10 days later. Make a follow-up appointment with your own caregiver or one to whom we have referred you. A radiologist (specialist in reading X-rays) may read your X-rays. Make sure you know how you are to obtain your X-ray results. Do not assume everything is normal if you do not hear from us. SEEK MEDICAL CARE IF: Bruising, swelling, or pain increases. SEEK IMMEDIATE MEDICAL CARE IF:   Your fingers or toes are numb or blue.   The pain is not responding to medications and continues to stay the same or get worse.   The pain in your joint becomes severe.   You develop a fever over   102 F (38.9 C).   It becomes impossible to move or use the joint.  MAKE SURE YOU:   Understand these instructions.   Will watch your condition.   Will get help right away if you are not doing well or get worse.

## 2012-01-12 NOTE — ED Provider Notes (Signed)
History     CSN: [id number]  Arrival date & time 01/12/12  0403   First MD Initiated Contact with Patient 01/12/12 [phone]      Chief Complaint  Patient presents with  . Shoulder Pain    (Consider location/radiation/quality/duration/timing/severity/associated sxs/prior treatment) HPI History provided by patient. Chief complaint is left shoulder pain that radiates from her left lateral neck down her arm but mostly in her shoulder. Patient also reports that she's been having left thigh pain the last few weeks, and that she feels like this is a similar pain. 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 Her primary care physician ordered an MRI, but she tells me that her insurance company would not cover it so that test was canceled. Patient has been referred to an orthopedic surgeon for further evaluation of her thigh pain and in the meantime she has now developed shoulder and arm pain. She denies any lower or mid back pain. No midline cervical pain. No trauma. No fevers or chills. No rash. No chest pain or shortness of breath. No swelling. Pain is reproduced by movement with no known aggravating or alleviating factors otherwise. Patient has been taking NSAIDs with Prilosec with intermittent relief.  Past Medical History  Diagnosis Date  . GERD (gastroesophageal reflux disease)   . Diverticulitis of colon     Past Surgical History  Procedure Date  . Cholecystectomy   . Tumor removed     benign removed from left calf  . Colonoscopy 08/01/08    Friable anal canal, otherwise, normal rectum and pancolonic    . Appendectomy     History reviewed. No pertinent family history.  History  Substance Use Topics  . Smoking status: Never Smoker   . Smokeless tobacco: Not on file  . Alcohol Use: No    OB History    Grav Para Term Preterm Abortions TAB SAB Ect Mult Living                  Review of Systems   Constitutional: Negative for fever and chills.  HENT: Negative for neck pain and neck stiffness.   Eyes: Negative for pain.  Respiratory: Negative for shortness of breath.   Cardiovascular: Negative for chest pain and leg swelling.  Gastrointestinal: Negative for abdominal pain.  Genitourinary: Negative for dysuria.  Musculoskeletal: Negative for back pain, joint swelling and gait problem.  Skin: Negative for rash.  Neurological: Negative for headaches.  All other systems reviewed and are negative.    Allergies  Review of patient's allergies indicates no known allergies.  Home Medications   Current Outpatient Rx  Name Route Sig Dispense Refill  . ASPIRIN 81 MG PO TABS Oral Take 81 mg by mouth daily.    Marland Kitchen BISMUTH SUBSALICYLATE 262 MG PO CHEW Oral Chew 524 mg by mouth as needed.      Marland Kitchen DIHYDROXYALUMINUM SOD CARB 334 MG PO CHEW Oral Chew 1 tablet by mouth 2 (two) times daily with a meal.      . IBUPROFEN 200 MG PO TABS Oral Take 200 mg by mouth every 6 (six) hours as needed.      Marland Kitchen OMEPRAZOLE 20 MG PO CPDR Oral Take 20 mg by mouth daily.      Marland Kitchen POLYETHYLENE GLYCOL 3350 PO PACK Oral Take 17 g by mouth daily.        BP 122/79  Pulse 69  Temp(Src)  99.2 F (37.3 C) (Oral)  Resp 18  Ht 5\' 2"  (1.575 m)  Wt 180 lb (81.647 kg)  BMI 32.92 kg/m2  SpO2 99%  Physical Exam  Constitutional: She is oriented to person, place, and time. She appears well-developed and well-nourished.  HENT:  Head: Normocephalic and atraumatic.  Eyes: Conjunctivae and EOM are normal. Pupils are equal, round, and reactive to light.  Neck: Trachea normal. Neck supple. No thyromegaly present.  Cardiovascular: Normal rate, regular rhythm, S1 normal, S2 normal and normal pulses.     No systolic murmur is present   No diastolic murmur is present  Pulses:      Radial pulses are 2+ on the right side, and 2+ on the left side.  Pulmonary/Chest: Effort normal and breath sounds normal. She has no wheezes. She has  no rhonchi. She has no rales. She exhibits no tenderness.  Abdominal: Soft. Normal appearance and bowel sounds are normal. There is no tenderness. There is no CVA tenderness and negative Murphy's sign.  Musculoskeletal:       BLE:s Calves nontender, no cords or erythema, negative Homans sign  Localizes discomfort left shoulder with full range of motion and no deformity. Mild tenderness at the shoulder and trapezial area without erythema or swelling appreciated. No midline cervical tenderness or deformity. Distal neurovascular intact x4 extremities. Left lower extremity evaluated without reproducible tenderness, swelling or any erythema.  Neurological: She is alert and oriented to person, place, and time. She has normal strength. No cranial nerve deficit or sensory deficit. GCS eye subscore is 4. GCS verbal subscore is 5. GCS motor subscore is 6.  Skin: Skin is warm and dry. No rash noted. She is not diaphoretic.  Psychiatric: Her speech is normal.       Cooperative and appropriate    ED Course  Procedures (including critical care time)  Labs Reviewed - No data to display Dg Cervical Spine Complete  01/12/2012  *RADIOLOGY REPORT*  Clinical Data: Left-sided neck pain.  No known injury.  CERVICAL SPINE - 4+ VIEWS  Comparison:  None.  Findings:  There is no evidence of cervical spine fracture or prevertebral soft tissue swelling.  Alignment is normal.  No other significant bone abnormalities are identified.  IMPRESSION: Negative cervical spine radiographs.  Original Report Authenticated By: Danae Orleans, M.D.   Dg Shoulder Left  01/12/2012  *RADIOLOGY REPORT*  Clinical Data: Left shoulder pain.  No known injury.  LEFT SHOULDER - 2+ VIEW  Comparison:  None.  Findings:  There is no evidence of fracture or dislocation.  There is no evidence of arthropathy or other focal bone abnormality. Soft tissues are unremarkable.  IMPRESSION: Negative.  Original Report Authenticated By: Danae Orleans, M.D.       MDM   Left shoulder pain and some trapezial tenderness. Imaging of cervical spine and shoulder obtained and reviewed as above. Motrin provided for discomfort and on recheck some improvement of pain. No deficits or indication for emergent MRI. Plan outpatient followup with orthopedics as scheduled and primary care followup for persistent symptoms. Short course of Valium provided with prescription for Motrin 800 mg. Patient is on Prilosec and denies history of peptic ulcer bleeding.        Sunnie Nielsen, MD 01/12/12 [phone]

## 2012-01-12 NOTE — ED Notes (Signed)
Pt reporting pain in left shoulder and arm.  Reports she also has pain in left side of neck and head.  States this has been going on "for a while now, but is worse tonight."  Pt also reports that she occasionally experiences same on right side. Reporting back pain as well.

## 2012-12-17 ENCOUNTER — Ambulatory Visit (INDEPENDENT_AMBULATORY_CARE_PROVIDER_SITE_OTHER): Payer: 59 | Admitting: Otolaryngology

## 2013-01-18 ENCOUNTER — Encounter (HOSPITAL_COMMUNITY): Payer: Self-pay | Admitting: *Deleted

## 2013-01-18 ENCOUNTER — Emergency Department (HOSPITAL_COMMUNITY)
Admission: EM | Admit: 2013-01-18 | Discharge: 2013-01-18 | Disposition: A | Discharge status: Home / Self Care | Payer: 59 | Attending: Emergency Medicine | Admitting: Emergency Medicine

## 2013-01-18 DIAGNOSIS — R0602 Shortness of breath: Secondary | ICD-10-CM | POA: Insufficient documentation

## 2013-01-18 DIAGNOSIS — Z7982 Long term (current) use of aspirin: Secondary | ICD-10-CM | POA: Insufficient documentation

## 2013-01-18 DIAGNOSIS — R0989 Other specified symptoms and signs involving the circulatory and respiratory systems: Secondary | ICD-10-CM | POA: Insufficient documentation

## 2013-01-18 DIAGNOSIS — Z8719 Personal history of other diseases of the digestive system: Secondary | ICD-10-CM | POA: Insufficient documentation

## 2013-01-18 DIAGNOSIS — R05 Cough: Secondary | ICD-10-CM | POA: Insufficient documentation

## 2013-01-18 DIAGNOSIS — K219 Gastro-esophageal reflux disease without esophagitis: Secondary | ICD-10-CM | POA: Insufficient documentation

## 2013-01-18 DIAGNOSIS — Z79899 Other long term (current) drug therapy: Secondary | ICD-10-CM | POA: Insufficient documentation

## 2013-01-18 DIAGNOSIS — J029 Acute pharyngitis, unspecified: Secondary | ICD-10-CM | POA: Insufficient documentation

## 2013-01-18 DIAGNOSIS — R059 Cough, unspecified: Secondary | ICD-10-CM | POA: Insufficient documentation

## 2013-01-18 MED ORDER — PREDNISONE 10 MG PO TABS: 20.0000 mg | ORAL_TABLET | Freq: Every day | ORAL | Status: DC

## 2013-01-18 MED ORDER — ALBUTEROL SULFATE (5 MG/ML) 0.5% IN NEBU
2.5000 mg | INHALATION_SOLUTION | Freq: Once | RESPIRATORY_TRACT | Status: AC
  Administered 2013-01-18: 2.5 mg via RESPIRATORY_TRACT
  Filled 2013-01-18: qty 0.5

## 2013-01-18 MED ORDER — GUAIFENESIN-CODEINE 100-10 MG/5ML PO SOLN
5.0000 mL | Freq: Once | ORAL | Status: AC
  Administered 2013-01-18: 5 mL via ORAL
  Filled 2013-01-18: qty 5

## 2013-01-18 MED ORDER — IPRATROPIUM BROMIDE 0.02 % IN SOLN
0.5000 mg | Freq: Once | RESPIRATORY_TRACT | Status: AC
  Administered 2013-01-18: 0.5 mg via RESPIRATORY_TRACT
  Filled 2013-01-18: qty 2.5

## 2013-01-18 MED ORDER — PREDNISONE 50 MG PO TABS
60.0000 mg | ORAL_TABLET | Freq: Once | ORAL | Status: AC
  Administered 2013-01-18: 60 mg via ORAL
  Filled 2013-01-18: qty 1

## 2013-01-18 MED ORDER — DEXTROMETHORPHAN HBR 15 MG/5ML PO SYRP: 10.0000 mL | ORAL_SOLUTION | Freq: Four times a day (QID) | ORAL | Status: DC | PRN

## 2013-01-18 NOTE — ED Notes (Signed)
Pt alert & oriented x4, stable gait. Patient given discharge instructions, paperwork & prescription(s). Patient  instructed to stop at the registration desk to finish any additional paperwork. Patient verbalized understanding. Pt left department w/ no further questions. 

## 2013-01-18 NOTE — ED Notes (Signed)
Pt reports a cough the started about a week ago, pt stating she can hardly breath now.

## 2013-01-18 NOTE — ED Provider Notes (Signed)
History     CSN: [id number]  Arrival date & time 01/18/13  0347   First MD Initiated Contact with Patient 01/18/13 0406      Chief Complaint  Patient presents with  . Cough    (Consider location/radiation/quality/duration/timing/severity/associated sxs/prior treatment) HPI Lauren Glover is a 47 y.o. female who presents to the Emergency Department complaining of cough that has returned a week ago and now is causing sore throat, chest discomfort, shortness of breath. She denies fever, chills, vomiting, diarrhea.   PCP Dr. Phillips Odor  Past Medical History  Diagnosis Date  . GERD (gastroesophageal reflux disease)   . Diverticulitis of colon     Past Surgical History  Procedure Laterality Date  . Cholecystectomy    . 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 Colonoscopy  08/01/08    Friable anal canal, otherwise, normal rectum and pancolonic    . Appendectomy    . Kidney stone surgery      No family history on file.  History  Substance Use Topics  . Smoking status: Never Smoker   . Smokeless tobacco: Not on file  . Alcohol Use: No    OB History   Grav Para Term Preterm Abortions TAB SAB Ect Mult Living                  Review of Systems  Constitutional: Negative for fever.       10 Systems reviewed and are negative for acute change except as noted in the HPI.  HENT: Negative for congestion.   Eyes: Negative for discharge and redness.  Respiratory: Positive for cough. Negative for shortness of breath.   Cardiovascular: Negative for chest pain.  Gastrointestinal: Negative for vomiting and abdominal pain.  Musculoskeletal: Negative for back pain.  Skin: Negative for rash.  Neurological: Negative for syncope, numbness and headaches.  Psychiatric/Behavioral:       No behavior change.    Allergies  Review of patient's allergies indicates no known allergies.  Home Medications   Current Outpatient Rx  Name  Route  Sig  Dispense  Refill  . albuterol  (PROVENTIL HFA;VENTOLIN HFA) 108 (90 BASE) MCG/ACT inhaler   Inhalation   Inhale 2 puffs into the lungs every 6 (six) hours as needed for wheezing.         Marland Kitchen aspirin 81 MG tablet   Oral   Take 81 mg by mouth daily.         . benzonatate (TESSALON) 200 MG capsule   Oral   Take 200 mg by mouth 3 (three) times daily as needed for cough.         . bismuth subsalicylate (PEPTO BISMOL) 262 MG chewable tablet   Oral   Chew 524 mg by mouth as needed.           . calcium carbonate-magnesium hydroxide (ROLAIDS) 334 MG CHEW   Oral   Chew 1 tablet by mouth 2 (two) times daily with a meal.           . ibuprofen (ADVIL,MOTRIN) 200 MG tablet   Oral   Take 200 mg by mouth every 6 (six) hours as needed.           Marland Kitchen omeprazole (PRILOSEC) 20 MG capsule   Oral   Take 20 mg by mouth daily.           . polyethylene glycol (MIRALAX / GLYCOLAX) packet   Oral   Take 17  g by mouth daily.             BP 137/86  Pulse 83  Temp(Src) 97.9 F (36.6 C) (Oral)  Resp 20  Ht 5\' 2"  (1.575 m)  Wt 180 lb (81.647 kg)  BMI 32.91 kg/m2  SpO2 99%  LMP 01/04/2013  Physical Exam  Nursing note and vitals reviewed. Constitutional: She appears well-developed and well-nourished.  Awake, alert, nontoxic appearance.  HENT:  Head: Normocephalic and atraumatic.  Right Ear: External ear normal.  Left Ear: External ear normal.  Mouth/Throat: Oropharynx is clear and moist.  Eyes: EOM are normal. Pupils are equal, round, and reactive to light.  Neck: Neck supple.  Cardiovascular: Normal rate and intact distal pulses.   Pulmonary/Chest: Effort normal and breath sounds normal. She exhibits no tenderness.  Abdominal: Soft. There is no tenderness. There is no rebound.  Musculoskeletal: She exhibits no tenderness.  Baseline ROM, no obvious new focal weakness.  Neurological:  Mental status and motor strength appears baseline for patient and situation.  Skin: No rash noted.  Psychiatric: She has a  normal mood and affect.    ED Course  Procedures (including critical care time) Medications  albuterol (PROVENTIL) (5 MG/ML) 0.5% nebulizer solution 2.5 mg (2.5 mg Nebulization Given 01/18/13 0432)  ipratropium (ATROVENT) nebulizer solution 0.5 mg (0.5 mg Nebulization Given 01/18/13 0432)  predniSONE (DELTASONE) tablet 60 mg (60 mg Oral Given 01/18/13 0428)  guaiFENesin-codeine 100-10 MG/5ML solution 5 mL (5 mLs Oral Given 01/18/13 0428)      MDM  Patient with a cough for a week associated with chest discomfort and sore throat. Given albtuerol/atrovent, prednisone, and robitussin AC with improvement. Pt stable in ED with no significant deterioration in condition.The patient appears reasonably screened and/or stabilized for discharge and I doubt any other medical condition or other [facility] requiring further screening, evaluation, or treatment in the ED at this time prior to discharge.  MDM Reviewed: nursing note and vitals           Nicoletta Dress. Colon Branch, MD 01/18/13 1478

## 2013-03-02 ENCOUNTER — Emergency Department (HOSPITAL_COMMUNITY)
Admission: EM | Admit: 2013-03-02 | Discharge: 2013-03-02 | Disposition: A | Discharge status: Home / Self Care | Payer: 59 | Attending: Emergency Medicine | Admitting: Emergency Medicine

## 2013-03-02 ENCOUNTER — Encounter (HOSPITAL_COMMUNITY): Payer: Self-pay | Admitting: *Deleted

## 2013-03-02 DIAGNOSIS — Z79899 Other long term (current) drug therapy: Secondary | ICD-10-CM | POA: Insufficient documentation

## 2013-03-02 DIAGNOSIS — M79609 Pain in unspecified limb: Secondary | ICD-10-CM | POA: Insufficient documentation

## 2013-03-02 DIAGNOSIS — M5430 Sciatica, unspecified side: Secondary | ICD-10-CM | POA: Insufficient documentation

## 2013-03-02 DIAGNOSIS — K219 Gastro-esophageal reflux disease without esophagitis: Secondary | ICD-10-CM | POA: Insufficient documentation

## 2013-03-02 DIAGNOSIS — Z8719 Personal history of other diseases of the digestive system: Secondary | ICD-10-CM | POA: Insufficient documentation

## 2013-03-02 DIAGNOSIS — Z7982 Long term (current) use of aspirin: Secondary | ICD-10-CM | POA: Insufficient documentation

## 2013-03-02 DIAGNOSIS — M5432 Sciatica, left side: Secondary | ICD-10-CM

## 2013-03-02 MED ORDER — METHOCARBAMOL 500 MG PO TABS: ORAL_TABLET | ORAL | Status: DC

## 2013-03-02 MED ORDER — NAPROXEN 500 MG PO TABS: 500.0000 mg | ORAL_TABLET | Freq: Two times a day (BID) | ORAL | Status: DC

## 2013-03-02 MED ORDER — IBUPROFEN 800 MG PO TABS
800.0000 mg | ORAL_TABLET | Freq: Once | ORAL | Status: AC
  Administered 2013-03-02: 800 mg via ORAL
  Filled 2013-03-02: qty 1

## 2013-03-02 MED ORDER — HYDROCODONE-ACETAMINOPHEN 5-325 MG PO TABS: ORAL_TABLET | ORAL | Status: DC

## 2013-03-02 MED ORDER — METHOCARBAMOL 500 MG PO TABS
1000.0000 mg | ORAL_TABLET | Freq: Once | ORAL | Status: AC
  Administered 2013-03-02: 1000 mg via ORAL
  Filled 2013-03-02: qty 2

## 2013-03-02 NOTE — ED Provider Notes (Signed)
History     CSN: [id number]  Arrival date & time 03/02/13  1925   First MD Initiated Contact with Patient 03/02/13 1957      Chief Complaint  Patient presents with  . Back Pain  . Leg Pain    (Consider location/radiation/quality/duration/timing/severity/associated sxs/prior treatment) Patient is a 47 y.o. female presenting with back pain. The history is provided by the patient.  Back Pain Location:  Lumbar spine Quality:  Aching and shooting Radiates to:  L posterior upper leg, R posterior upper leg, L knee and L thigh Pain severity:  Moderate Pain is:  Same all the time Onset quality:  Gradual Duration:  2 days Timing:  Constant Progression:  Unchanged Chronicity:  Recurrent Context: twisting   Context: not falling and not recent injury   Relieved by:  Lying down Worsened by:  Twisting, standing, bending and sitting Ineffective treatments:  None tried Associated symptoms: leg pain   Associated symptoms: no abdominal pain, no abdominal swelling, no bladder incontinence, no bowel incontinence, no chest pain, no dysuria, no fever, no headaches, no numbness, no paresthesias, no pelvic pain, no perianal numbness, no tingling and no weakness     Past Medical History  Diagnosis Date  . GERD (gastroesophageal reflux disease)   . Diverticulitis of colon     Past Surgical History  Procedure Laterality Date  . Cholecystectomy    . 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 Colonoscopy  08/01/08    Friable anal canal, otherwise, normal rectum and pancolonic    . Appendectomy    . Kidney stone surgery      No family history on file.  History  Substance Use Topics  . Smoking status: Never Smoker   . Smokeless tobacco: Not on file  . Alcohol Use: No    OB History   Grav Para Term Preterm Abortions TAB SAB Ect Mult Living                  Review of Systems  Constitutional: Negative for fever, activity change and appetite change.  HENT: Negative for neck  pain and neck stiffness.   Respiratory: Negative for shortness of breath.   Cardiovascular: Negative for chest pain.  Gastrointestinal: Negative for vomiting, abdominal pain, constipation and bowel incontinence.  Genitourinary: Negative for bladder incontinence, dysuria, hematuria, flank pain, decreased urine volume, difficulty urinating and pelvic pain.       No perineal numbness or incontinence of urine or feces  Musculoskeletal: Positive for back pain. Negative for joint swelling.  Skin: Negative for rash.  Neurological: Negative for tingling, weakness, numbness, headaches and paresthesias.  All other systems reviewed and are negative.    Allergies  Review of patient's allergies indicates no known allergies.  Home Medications   Current Outpatient Rx  Name  Route  Sig  Dispense  Refill  . albuterol (PROVENTIL HFA;VENTOLIN HFA) 108 (90 BASE) MCG/ACT inhaler   Inhalation   Inhale 2 puffs into the lungs every 6 (six) hours as needed for wheezing.         Marland Kitchen aspirin 81 MG tablet   Oral   Take 81 mg by mouth daily.         . benzonatate (TESSALON) 200 MG capsule   Oral   Take 200 mg by mouth 3 (three) times daily as needed for cough.         . bismuth subsalicylate (PEPTO BISMOL) 262 MG chewable tablet   Oral  Chew 524 mg by mouth as needed.           . calcium carbonate-magnesium hydroxide (ROLAIDS) 334 MG CHEW   Oral   Chew 1 tablet by mouth 2 (two) times daily with a meal.           . dextromethorphan 15 MG/5ML syrup   Oral   Take 10 mLs (30 mg total) by mouth 4 (four) times daily as needed for cough.   120 mL   0   . HYDROcodone-acetaminophen (NORCO/VICODIN) 5-325 MG per tablet      Take one-two tabs po q 4-6 hrs prn pain   20 tablet   0   . ibuprofen (ADVIL,MOTRIN) 200 MG tablet   Oral   Take 200 mg by mouth every 6 (six) hours as needed.           . methocarbamol (ROBAXIN) 500 MG tablet      Take two tabs po TID x 7 days   42 tablet   0   .  naproxen (NAPROSYN) 500 MG tablet   Oral   Take 1 tablet (500 mg total) by mouth 2 (two) times daily. With a meal   20 tablet   0   . omeprazole (PRILOSEC) 20 MG capsule   Oral   Take 20 mg by mouth daily.           . polyethylene glycol (MIRALAX / GLYCOLAX) packet   Oral   Take 17 g by mouth daily.           . predniSONE (DELTASONE) 10 MG tablet   Oral   Take 2 tablets (20 mg total) by mouth daily.   10 tablet   0     BP 139/66  Pulse 76  Temp(Src) 97.5 F (36.4 C) (Oral)  Resp 18  Ht 5\' 2"  (1.575 m)  Wt 181 lb (82.101 kg)  BMI 33.1 kg/m2  SpO2 100%  LMP 02/16/2013  Physical Exam  Nursing note and vitals reviewed. Constitutional: She is oriented to person, place, and time. She appears well-developed and well-nourished. No distress.  HENT:  Head: Normocephalic and atraumatic.  Neck: Normal range of motion. Neck supple.  Cardiovascular: Normal rate, regular rhythm, normal heart sounds and intact distal pulses.   No murmur heard. Pulmonary/Chest: Effort normal and breath sounds normal. No respiratory distress.  Musculoskeletal: She exhibits tenderness. She exhibits no edema.       Lumbar back: She exhibits tenderness and pain. She exhibits normal range of motion, no swelling, no deformity, no laceration and normal pulse.  ttp of the lumbar paraspinal muscles and left SI joint.  No spinal tenderness.  DP pulses are brisk and symmetrical.  Distal sensation intact.  Hip Flexors/Extensors are intact  Neurological: She is alert and oriented to person, place, and time. No cranial nerve deficit or sensory deficit. She exhibits normal muscle tone. Coordination and gait normal.  Reflex Scores:      Patellar reflexes are 2+ on the right side and 2+ on the left side.      Achilles reflexes are 2+ on the right side and 2+ on the left side. Skin: Skin is warm and dry.    ED Course  Procedures (including critical care time)  Labs Reviewed - No data to display No results  found.      MDM    ttp of the left SI joint and left lumbar paraspinal muscles.  Pt ambulates with a steady gait.  No focal  neurological deficits on exam.  Doubt emergent neurological or infectious process.    Pt agrees to rest, ice, heat and close f/u with her PMD.  VSS, she appears stable for d/c        Tonesha Tsou L. Aryaa Bunting, PA-C 03/05/13 1325

## 2013-03-02 NOTE — ED Notes (Addendum)
Pt reporting pain in lower back, into legs, shins and arm.  States pains started last night, some relief with Ibuprofen. Reports pain increases when she stands to walk.

## 2013-03-02 NOTE — ED Notes (Signed)
Pain across lower back and down both thighs, Rt leg hurts down to shin. Hurts when getting up and down..  Pain lt arm intermittently. No known injury,  No urinary sx

## 2013-03-05 NOTE — ED Provider Notes (Signed)
Medical screening examination/treatment/procedure(s) were performed by non-physician practitioner and as supervising physician I was immediately available for consultation/collaboration. Devoria Albe, MD, Armando Gang   Ward Givens, MD 03/05/13 [phone]

## 2013-08-07 IMAGING — CR DG CERVICAL SPINE COMPLETE 4+V
7 series · 7 of 7 positions shown · non-contrast
Comparison: None.

CLINICAL DATA: Left-sided neck pain.  No known injury.

CERVICAL SPINE - 4+ VIEWS

[view not recorded (1 of 7)]
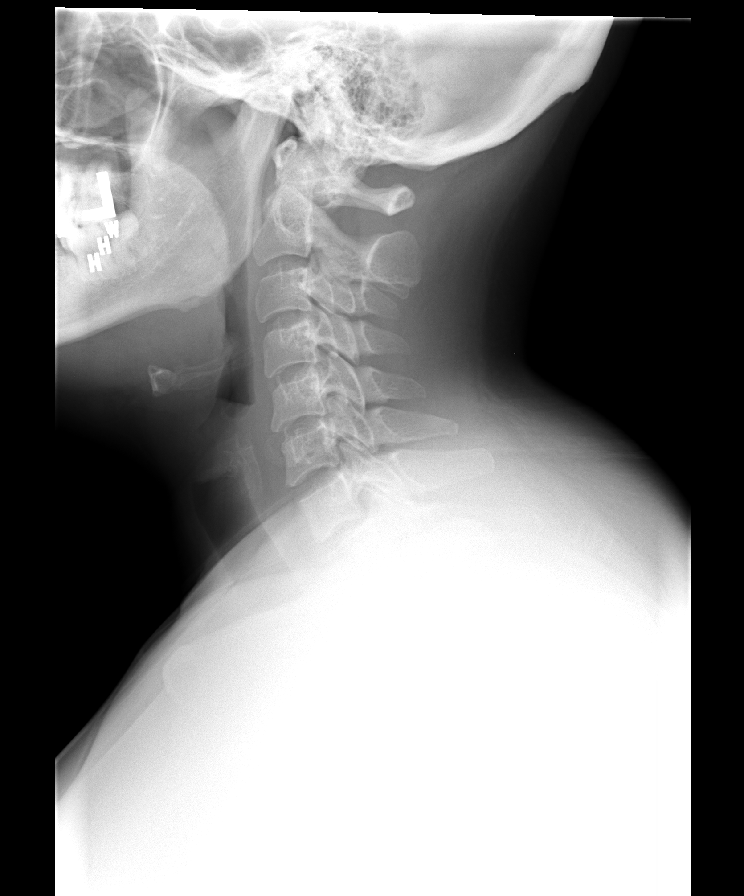

[view not recorded (2 of 7)]
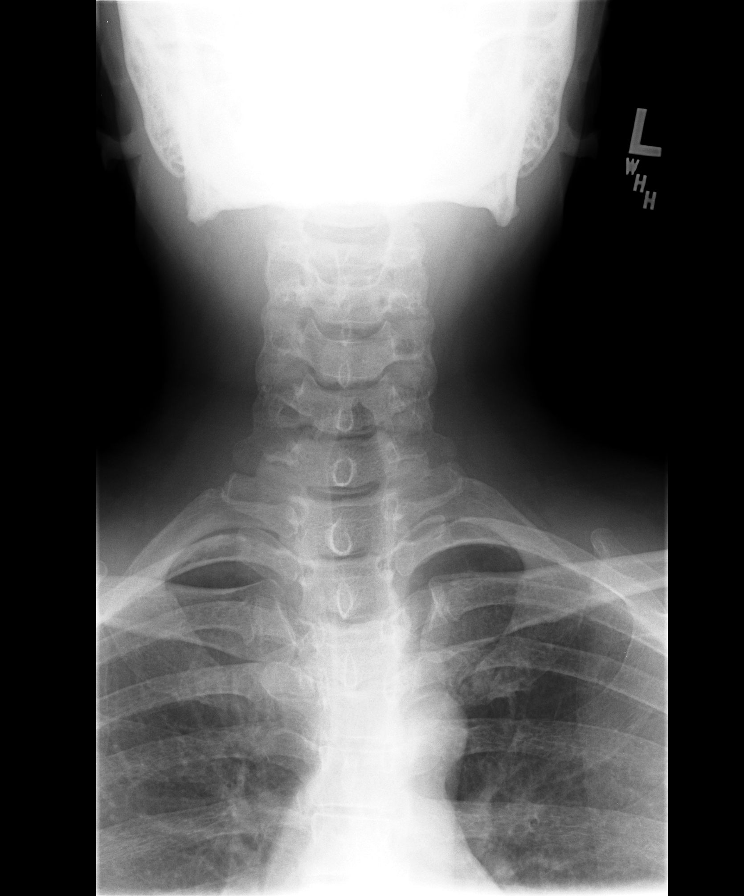

[view not recorded (3 of 7)]
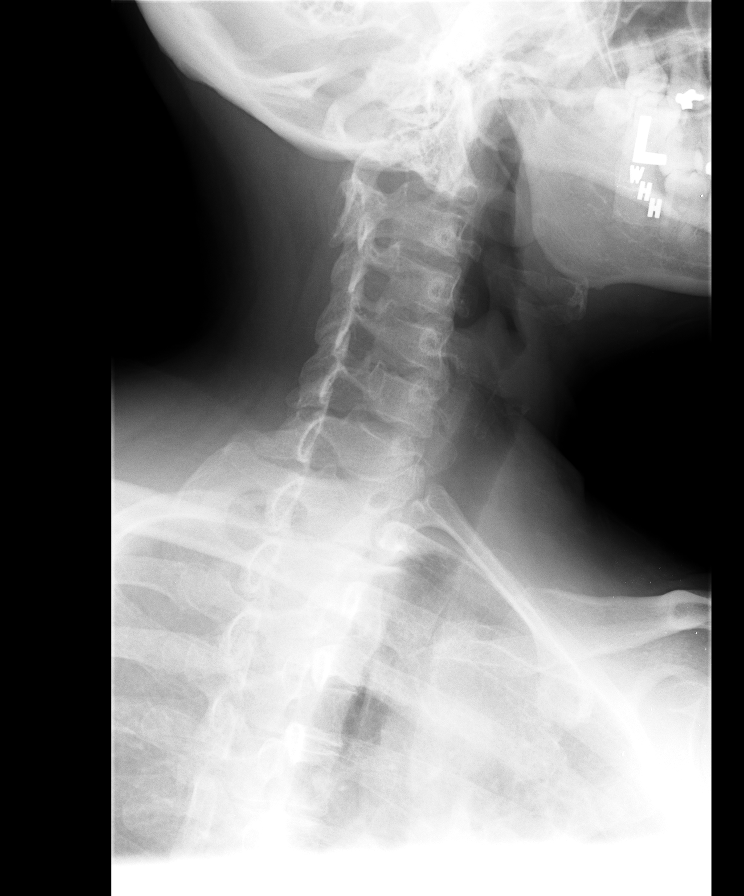

[view not recorded (4 of 7)]
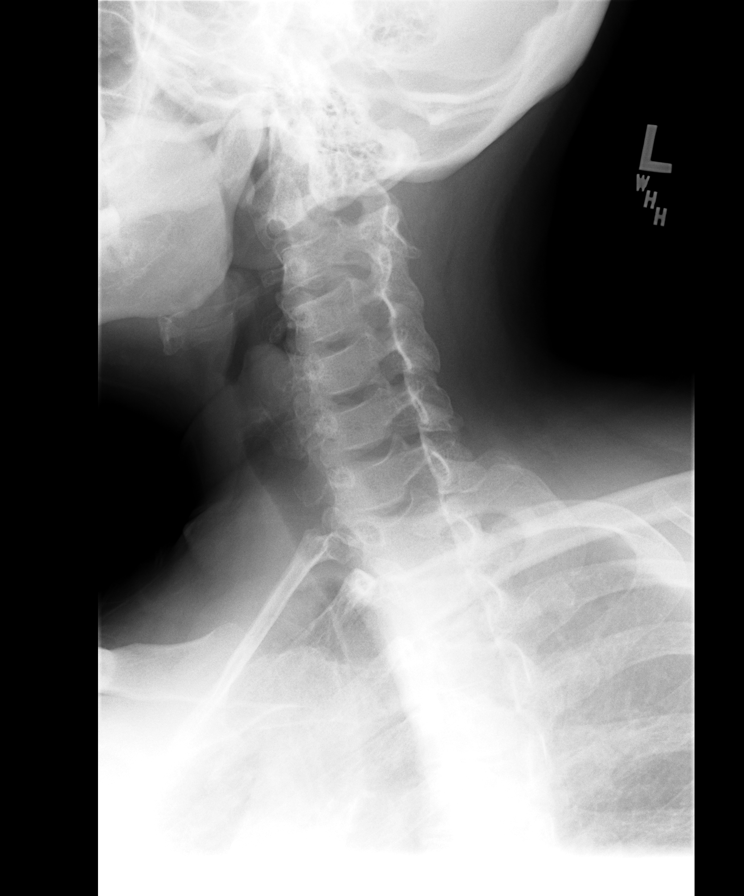

[view not recorded (5 of 7)]
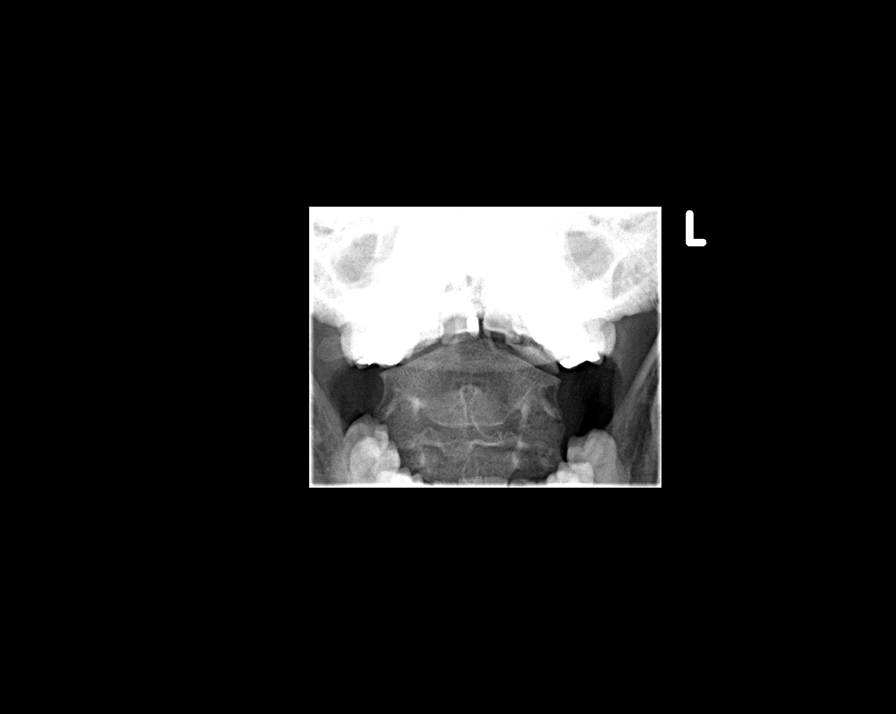

[view not recorded (6 of 7)]
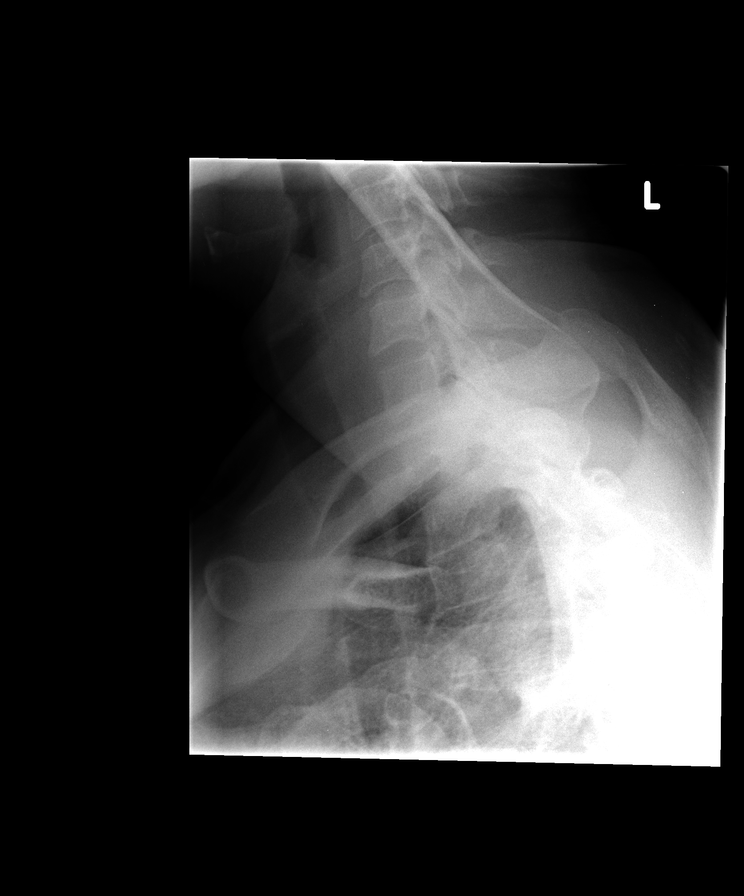

[view not recorded (7 of 7)]
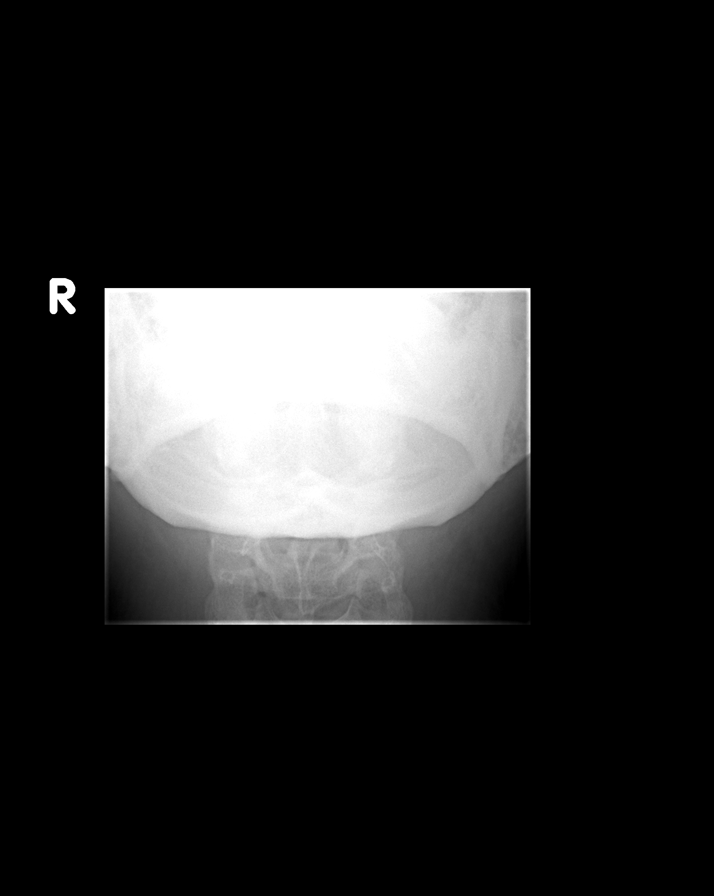

[7 of 7 positions shown; findings below may reference images not displayed]

FINDINGS: There is no evidence of cervical spine fracture or
prevertebral soft tissue swelling.  Alignment is normal.  No other
significant bone abnormalities are identified.
IMPRESSION: Negative cervical spine radiographs.

## 2014-02-14 ENCOUNTER — Ambulatory Visit: Payer: 59 | Admitting: Gastroenterology

## 2014-03-08 ENCOUNTER — Ambulatory Visit: Payer: 59 | Admitting: Gastroenterology

## 2015-01-25 ENCOUNTER — Encounter (HOSPITAL_COMMUNITY): Payer: Self-pay | Admitting: Emergency Medicine

## 2015-01-25 ENCOUNTER — Emergency Department (HOSPITAL_COMMUNITY)
Admission: EM | Admit: 2015-01-25 | Discharge: 2015-01-25 | Disposition: A | Discharge status: Home / Self Care | Payer: Non-veteran care | Attending: Emergency Medicine | Admitting: Emergency Medicine

## 2015-01-25 DIAGNOSIS — K219 Gastro-esophageal reflux disease without esophagitis: Secondary | ICD-10-CM | POA: Diagnosis not present

## 2015-01-25 DIAGNOSIS — N939 Abnormal uterine and vaginal bleeding, unspecified: Secondary | ICD-10-CM | POA: Diagnosis not present

## 2015-01-25 DIAGNOSIS — G4489 Other headache syndrome: Secondary | ICD-10-CM | POA: Insufficient documentation

## 2015-01-25 DIAGNOSIS — R05 Cough: Secondary | ICD-10-CM | POA: Insufficient documentation

## 2015-01-25 DIAGNOSIS — R109 Unspecified abdominal pain: Secondary | ICD-10-CM | POA: Insufficient documentation

## 2015-01-25 DIAGNOSIS — Z79899 Other long term (current) drug therapy: Secondary | ICD-10-CM | POA: Diagnosis not present

## 2015-01-25 DIAGNOSIS — Z9089 Acquired absence of other organs: Secondary | ICD-10-CM | POA: Insufficient documentation

## 2015-01-25 DIAGNOSIS — Z3202 Encounter for pregnancy test, result negative: Secondary | ICD-10-CM | POA: Diagnosis not present

## 2015-01-25 DIAGNOSIS — M545 Low back pain: Secondary | ICD-10-CM | POA: Insufficient documentation

## 2015-01-25 DIAGNOSIS — Z7952 Long term (current) use of systemic steroids: Secondary | ICD-10-CM | POA: Insufficient documentation

## 2015-01-25 DIAGNOSIS — Z791 Long term (current) use of non-steroidal anti-inflammatories (NSAID): Secondary | ICD-10-CM | POA: Insufficient documentation

## 2015-01-25 LAB — CBC WITH DIFFERENTIAL/PLATELET
Basophils Absolute: 0.1 10*3/uL (ref 0.0–0.1)
Basophils Relative: 1 % (ref 0–1)
EOS ABS: 0.2 10*3/uL (ref 0.0–0.7)
EOS PCT: 4 % (ref 0–5)
HEMATOCRIT: 39.4 % (ref 36.0–46.0)
HEMOGLOBIN: 13 g/dL (ref 12.0–15.0)
LYMPHS ABS: 2.1 10*3/uL (ref 0.7–4.0)
LYMPHS PCT: 53 % — AB (ref 12–46)
MCH: 32.9 pg (ref 26.0–34.0)
MCHC: 33 g/dL (ref 30.0–36.0)
MCV: 99.7 fL (ref 78.0–100.0)
MONOS PCT: 8 % (ref 3–12)
Monocytes Absolute: 0.3 10*3/uL (ref 0.1–1.0)
Neutro Abs: 1.4 10*3/uL — ABNORMAL LOW (ref 1.7–7.7)
Neutrophils Relative %: 34 % — ABNORMAL LOW (ref 43–77)
PLATELETS: 245 10*3/uL (ref 150–400)
RBC: 3.95 MIL/uL (ref 3.87–5.11)
RDW: 13.3 % (ref 11.5–15.5)
WBC: 4.1 10*3/uL (ref 4.0–10.5)

## 2015-01-25 LAB — URINALYSIS, ROUTINE W REFLEX MICROSCOPIC
BILIRUBIN URINE: NEGATIVE
Glucose, UA: NEGATIVE mg/dL
Ketones, ur: NEGATIVE mg/dL
Leukocytes, UA: NEGATIVE
NITRITE: NEGATIVE
PROTEIN: NEGATIVE mg/dL
Specific Gravity, Urine: >1.03 — ABNORMAL HIGH (ref 1.005–1.030)
UROBILINOGEN UA: 0.2 mg/dL (ref 0.0–1.0)
pH: 5.5 (ref 5.0–8.0)

## 2015-01-25 LAB — COMPREHENSIVE METABOLIC PANEL
ALK PHOS: 82 U/L (ref 39–117)
ALT: 16 U/L (ref 0–35)
AST: 22 U/L (ref 0–37)
Albumin: 4.1 g/dL (ref 3.5–5.2)
Anion gap: 7 (ref 5–15)
BILIRUBIN TOTAL: 0.7 mg/dL (ref 0.3–1.2)
BUN: 15 mg/dL (ref 6–23)
CALCIUM: 9.3 mg/dL (ref 8.4–10.5)
CHLORIDE: 107 mmol/L (ref 96–112)
CO2: 26 mmol/L (ref 19–32)
CREATININE: 0.78 mg/dL (ref 0.50–1.10)
GFR calc Af Amer: >90 mL/min (ref >90)
GLUCOSE: 101 mg/dL — AB (ref 70–99)
Potassium: 3.9 mmol/L (ref 3.5–5.1)
Sodium: 140 mmol/L (ref 135–145)
Total Protein: 7.1 g/dL (ref 6.0–8.3)

## 2015-01-25 LAB — PREGNANCY, URINE: PREG TEST UR: NEGATIVE

## 2015-01-25 LAB — URINE MICROSCOPIC-ADD ON

## 2015-01-25 MED ORDER — GI COCKTAIL ~~LOC~~
30.0000 mL | Freq: Once | ORAL | Status: AC
  Administered 2015-01-25: 30 mL via ORAL
  Filled 2015-01-25: qty 30

## 2015-01-25 NOTE — ED Notes (Signed)
Pt states that she has been having abd pain only after eating for the past 3-4 days.  States her stomach gets really bloated and hurts and that she feels like she needs to have a bowel movement after each meal.  States that stool is soft and formed but yellow in color.  Denies any nausea or vomiting.  Abd soft and nontender at this time.

## 2015-01-25 NOTE — Discharge Instructions (Signed)

## 2015-01-25 NOTE — ED Notes (Signed)
Pt reports abdominal pain,headache,itching,nausea, "yellow" stool for last several days. Pt reports symptoms get worse at night. nad noted at this time.

## 2015-01-25 NOTE — ED Provider Notes (Signed)
CSN: [id number]     Arrival date & time 01/25/15  6967 History  This chart was scribed for Ripley Fraise, MD by Mercy Moore, ED scribe.  This patient was seen in room APA19/APA19 and the patient's care was started at 10:53 AM.   Chief Complaint  Patient presents with  . Abdominal Pain   Patient is a 49 y.o. female presenting with abdominal pain. The history is provided by the patient. No language interpreter was used.  Abdominal Pain Pain location:  L flank and R flank Pain quality: cramping and fullness   Pain severity:  Mild Context: not previous surgeries and not trauma   Worsened by:  Eating Ineffective treatments:  Bowel activity and lying down Associated symptoms: chest pain, cough, nausea and vaginal bleeding   Associated symptoms: no dysuria, no fever, no hematochezia, no hematuria and no vomiting    HPI Comments: Lauren Glover is a 49 y.o. female who presents to the Emergency Department complaining of abdominal pain just after eating for the last 3 days. Patient reports bloating, abdominal discomfort, bilateral flank pain and lower back pain and occasional chest pain. Patient describes cramping, spasming and burning pain in her abdomen. Patient reports excretion of a soft, yellow stool over the past few days. Patient reports worsening of her symptoms at night when lying down.  Patient denies history of pain that is this severe. Patient shares history of GERD; patient takes Zantac, but reports that she's been prescribed Prilosec. Patient reports spotting and shares history of fibroid tumors. Patient states that she's suffered from IBS for several years.   Past Medical History  Diagnosis Date  . GERD (gastroesophageal reflux disease)   . Diverticulitis of colon    Past Surgical History  Procedure Laterality Date  . Cholecystectomy    . 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 Colonoscopy  08/01/08    ELF:[id number] anal canal, otherwise, normal rectum and pancolonic     . Appendectomy    . Kidney stone surgery    . Esophagogastroduodenoscopy   11/10/2007    [facility]:ENIDPO esophagus, hiatal hernia.  Small polypoid lesion in the cardia of uncertain significance, status post biopsy removal otherwise normal stomach   History reviewed. No pertinent family history. History  Substance Use Topics  . Smoking status: Never Smoker   . Smokeless tobacco: Not on file  . Alcohol Use: No   OB History    No data available     Review of Systems  Constitutional: Negative for fever.  Respiratory: Positive for cough.   Cardiovascular: Positive for chest pain.  Gastrointestinal: Positive for nausea and abdominal pain. Negative for vomiting, blood in stool and hematochezia.  Genitourinary: Positive for vaginal bleeding. Negative for dysuria and hematuria.  Musculoskeletal: Positive for back pain.  Neurological: Positive for headaches.  All other systems reviewed and are negative.     Allergies  Review of patient's allergies indicates no known allergies.  Home Medications   Prior to Admission medications   Medication Sig Start Date End Date Taking? Authorizing Provider  albuterol (PROVENTIL HFA;VENTOLIN HFA) 108 (90 BASE) MCG/ACT inhaler Inhale 2 puffs into the lungs every 6 (six) hours as needed for wheezing.   Yes Historical Provider, MD  ibuprofen (ADVIL,MOTRIN) 200 MG tablet Take 200 mg by mouth every 6 (six) hours as needed.     Yes Historical Provider, MD  omeprazole (PRILOSEC) 20 MG capsule Take 20 mg by mouth daily.  Yes Historical Provider, MD  ranitidine (ZANTAC) 75 MG tablet Take 75 mg by mouth 2 (two) times daily.   Yes Historical Provider, MD  dextromethorphan 15 MG/5ML syrup Take 10 mLs (30 mg total) by mouth 4 (four) times daily as needed for cough. Patient not taking: Reported on 01/25/2015 01/18/13   Prentiss Bells, MD  HYDROcodone-acetaminophen (NORCO/VICODIN) 5-325 MG per tablet Take one-two tabs po q 4-6 hrs prn pain Patient not taking:  Reported on 01/25/2015 03/02/13   Tammy Triplett, PA-C  methocarbamol (ROBAXIN) 500 MG tablet Take two tabs po TID x 7 days Patient not taking: Reported on 01/25/2015 03/02/13   Tammy Triplett, PA-C  naproxen (NAPROSYN) 500 MG tablet Take 1 tablet (500 mg total) by mouth 2 (two) times daily. With a meal Patient not taking: Reported on 01/25/2015 03/02/13   Tammy Triplett, PA-C  predniSONE (DELTASONE) 10 MG tablet Take 2 tablets (20 mg total) by mouth daily. Patient not taking: Reported on 01/25/2015 01/18/13   Prentiss Bells, MD   Triage Vitals: BP 144/94 mmHg  Pulse 73  Temp(Src) 97.8 F (36.6 C) (Oral)  Resp 18  Ht 5\' 2"  (1.575 m)  Wt 175 lb (79.379 kg)  BMI 32.00 kg/m2  SpO2 100%  LMP 01/04/2015 Physical Exam  Nursing note and vitals reviewed. CONSTITUTIONAL: Well developed/well nourished HEAD: Normocephalic/atraumatic EYES: EOMI/PERRL ENMT: Mucous membranes moist NECK: supple no meningeal signs SPINE/BACK:entire spine nontender CV: S1/S2 noted, no murmurs/rubs/gallops noted LUNGS: Lungs are clear to auscultation bilaterally, no apparent distress ABDOMEN: soft, nontender, no rebound or guarding, bowel sounds noted throughout abdomen GU:no cva tenderness NEURO: Pt is awake/alert/appropriate, moves all extremitiesx4.  No facial droop.   EXTREMITIES: pulses normal/equal, full ROM SKIN: warm, color normal PSYCH: no abnormalities of mood noted, alert and oriented to situation   ED Course  Procedures   COORDINATION OF CARE: 11:03 AM- Discussed treatment plan with patient at bedside and patient agreed to plan.    Pt improved Taking PO She has no focal abdominal tenderness  I feel she is safe for d/c home Referred to PCP Labs Review Labs Reviewed  CBC WITH DIFFERENTIAL/PLATELET - Abnormal; Notable for the following:    Neutrophils Relative % 34 (*)    Neutro Abs 1.4 (*)    Lymphocytes Relative 53 (*)    All other components within normal limits  COMPREHENSIVE METABOLIC PANEL -  Abnormal; Notable for the following:    Glucose, Bld 101 (*)    All other components within normal limits  URINALYSIS, ROUTINE W REFLEX MICROSCOPIC - Abnormal; Notable for the following:    Specific Gravity, Urine >1.030 (*)    Hgb urine dipstick TRACE (*)    All other components within normal limits  URINE MICROSCOPIC-ADD ON - Abnormal; Notable for the following:    Squamous Epithelial / LPF MANY (*)    Bacteria, UA MANY (*)    All other components within normal limits  PREGNANCY, URINE     EKG Interpretation   Date/Time:  Wednesday January 25 2015 11:15:33 EDT Ventricular Rate:  59 PR Interval:  147 QRS Duration: 78 QT Interval:  411 QTC Calculation: 407 R Axis:   65 Text Interpretation:  Sinus rhythm Baseline wander in lead(s) V2 ECG  OTHERWISE WITHIN NORMAL LIMITS No previous ECGs available Confirmed by  Christy Gentles  MD, Emmalynn Pinkham ([id number]) on 01/25/2015 11:19:06 AM     Medications  gi cocktail (Maalox,Lidocaine,Donnatal) (30 mLs Oral Given 01/25/15 1109)    MDM   Final diagnoses:  Abdominal pain, unspecified abdominal location  Other headache syndrome    Nursing notes including past medical history and social history reviewed and considered in documentation Labs/vital reviewed myself and considered during evaluation   I personally performed the services described in this documentation, which was scribed in my presence. The recorded information has been reviewed and is accurate.      Ripley Fraise, MD 01/25/15 [phone]

## 2015-07-31 ENCOUNTER — Telehealth: Payer: Self-pay | Admitting: Gastroenterology

## 2015-07-31 ENCOUNTER — Ambulatory Visit: Payer: Non-veteran care | Admitting: Gastroenterology

## 2015-07-31 ENCOUNTER — Encounter: Payer: Self-pay | Admitting: Gastroenterology

## 2015-07-31 NOTE — Telephone Encounter (Signed)
PATIENT WAS A NO SHOW AND LETTER SENT  °

## 2015-08-17 ENCOUNTER — Other Ambulatory Visit: Payer: Self-pay

## 2015-08-17 ENCOUNTER — Ambulatory Visit (INDEPENDENT_AMBULATORY_CARE_PROVIDER_SITE_OTHER): Payer: 59 | Admitting: Gastroenterology

## 2015-08-17 ENCOUNTER — Encounter: Payer: Self-pay | Admitting: Gastroenterology

## 2015-08-17 VITALS — BP 131/84 | HR 80 | Temp 97.9°F | Ht 61.0 in | Wt 169.4 lb

## 2015-08-17 DIAGNOSIS — Z1211 Encounter for screening for malignant neoplasm of colon: Secondary | ICD-10-CM | POA: Diagnosis not present

## 2015-08-17 DIAGNOSIS — K5900 Constipation, unspecified: Secondary | ICD-10-CM | POA: Insufficient documentation

## 2015-08-17 MED ORDER — LINACLOTIDE 145 MCG PO CAPS: 145.0000 ug | ORAL_CAPSULE | Freq: Every day | ORAL | Status: DC

## 2015-08-17 MED ORDER — PEG 3350-KCL-NA BICARB-NACL 420 G PO SOLR: 4000.0000 mL | Freq: Once | ORAL | Status: DC

## 2015-08-17 NOTE — Patient Instructions (Signed)
For constipation: start taking Linzess 1 capsule each morning, 30 minutes before breakfast. You may have some diarrhea for a few days but it should get better. If not, call me.  We have scheduled you for a colonoscopy with Dr. Gala Romney in the near future.

## 2015-08-17 NOTE — Assessment & Plan Note (Signed)
49 year old female with need for screening colonoscopy, due for 5 year screening secondary to family history of colon polyps per last recommendations. No concerning lower GI symptoms. Chronic constipation with failure of OTC agents historically. Possible scant tissue paper hematochezia likely benign in this setting.   Proceed with TCS with Dr. Gala Romney in near future: the risks, benefits, and alternatives have been discussed with the patient in detail. The patient states understanding and desires to proceed. Linzess 145 mcg daily

## 2015-08-17 NOTE — Progress Notes (Signed)
Primary Care Physician:  Purvis Kilts, MD Primary Gastroenterologist:  Dr. Gala Romney   Chief Complaint  Patient presents with  . Colonoscopy    HPI:   Lauren Glover is a 49 y.o. female presenting today to arrange screening colonoscopy. Last evaluation in 2009 without polyps, but due to family history of colon polyps, she is recommended for 5 year surveillance. History of chronic constipation.   Dealing with chronic constipation. Vague abdominal bloating. Will go several days then only have a small one. Has taken Miralax in the past but not taking now. Possible scant tissue hematochezia. No unexplained weight loss or lack of appetite. Prilosec just as needed. No dysphagia.    Past Medical History  Diagnosis Date  . GERD (gastroesophageal reflux disease)   . Diverticulitis of colon   . Constipation     Past Surgical History  Procedure Laterality Date  . Cholecystectomy    . 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 Colonoscopy  08/01/08    [id number] anal canal, otherwise, normal rectum and pancolonic diveritcula. Surveillance in 5 years due to family history of colon polyps.   Marland Kitchen Appendectomy    . Kidney stone surgery    . Esophagogastroduodenoscopy   11/10/2007    Dr. Rourk:Normal esophagus, hiatal hernia.  Small polypoid lesion in the cardia of uncertain significance, status post biopsy removal otherwise normal stomach. Benign fundic gland polyp     Current Outpatient Prescriptions  Medication Sig Dispense Refill  . albuterol (PROVENTIL HFA;VENTOLIN HFA) 108 (90 BASE) MCG/ACT inhaler Inhale 2 puffs into the lungs every 6 (six) hours as needed for wheezing.    Marland Kitchen ibuprofen (ADVIL,MOTRIN) 200 MG tablet Take 200 mg by mouth every 6 (six) hours as needed.      Marland Kitchen omeprazole (PRILOSEC) 20 MG capsule Take 20 mg by mouth daily.      . Linaclotide (LINZESS) 145 MCG CAPS capsule Take 1 capsule (145 mcg total) by mouth daily. 30 minutes before breakfast 30 capsule 3    No current facility-administered medications for this visit.    Allergies as of 08/17/2015  . (No Known Allergies)    Family History  Problem Relation Age of Onset  . Colon polyps Sister   . Colon cancer Neg Hx     Social History   Social History  . Marital Status: Single    Spouse Name: N/A  . Number of Children: N/A  . Years of Education: N/A   Occupational History  . [employer]    Social History Main Topics  . Smoking status: Never Smoker   . Smokeless tobacco: Not on file  . Alcohol Use: No  . Drug Use: No  . Sexual Activity: Yes    Birth Control/ Protection: None   Other Topics Concern  . Not on file   Social History Narrative    Review of Systems: Gen: Denies any fever, chills, fatigue, weight loss, lack of appetite.  CV: Denies chest pain, heart palpitations, peripheral edema, syncope.  Resp: Denies shortness of breath at rest or with exertion. Denies wheezing or cough.  GI: see HPI  GU : Denies urinary burning, urinary frequency, urinary hesitancy MS: +joint pain  Derm: Denies rash, itching, dry skin Psych: Denies depression, anxiety, memory loss, and confusion Heme: Denies bruising, bleeding, and enlarged lymph nodes.  Physical Exam: BP 131/84 mmHg  Pulse 80  Temp(Src) 97.9 F (36.6 C) (Oral)  Ht 5\' 1"  (1.549 m)  Wt  169 lb 6.4 oz (76.839 kg)  BMI 32.02 kg/m2  LMP 08/17/2015 General:   Alert and oriented. Pleasant and cooperative. Well-nourished and well-developed.  Head:  Normocephalic and atraumatic. Eyes:  Without icterus, sclera clear and conjunctiva pink.  Ears:  Normal auditory acuity. Nose:  No deformity, discharge,  or lesions. Mouth:  No deformity or lesions, oral mucosa pink.  Lungs:  Clear to auscultation bilaterally. No wheezes, rales, or rhonchi. No distress.  Heart:  S1, S2 present without murmurs appreciated.  Abdomen:  +BS, soft, non-tender and non-distended. No HSM noted. No guarding or rebound. No masses appreciated.   Rectal:  Deferred  Msk:  Symmetrical without gross deformities. Normal posture. Extremities:  Without clubbing or edema. Neurologic:  Alert and  oriented x4;  grossly normal neurologically. Skin:  Intact without significant lesions or rashes. Psych:  Alert and cooperative. Normal mood and affect.

## 2015-08-17 NOTE — Progress Notes (Signed)
cc'ed to pcp °

## 2015-08-17 NOTE — Assessment & Plan Note (Signed)
Start Linzess 145 mcg daily.  

## 2015-08-30 ENCOUNTER — Telehealth: Payer: Self-pay

## 2015-08-30 ENCOUNTER — Telehealth: Payer: Self-pay | Admitting: Internal Medicine

## 2015-08-30 NOTE — Telephone Encounter (Signed)
Called and LMOM 

## 2015-08-30 NOTE — Telephone Encounter (Signed)
Called pt and LMOM to call office back  

## 2015-08-30 NOTE — Telephone Encounter (Signed)
Patient is scheduled with R[facility] on Thursday for colonoscopy and wants to reschedule where she can have it done on a Monday due to drinking the prep and her work. Please call her at [phone]

## 2015-08-30 NOTE — Telephone Encounter (Signed)
Reference number for PA for TCS is [id number]

## 2015-08-30 NOTE — Telephone Encounter (Signed)
Pt rescheduled to 09/11/2015. Mailed new instructions to patient

## 2015-09-11 ENCOUNTER — Ambulatory Visit (HOSPITAL_COMMUNITY)
Admission: RE | Admit: 2015-09-11 | Discharge: 2015-09-11 | Disposition: A | Discharge status: Home / Self Care | Payer: 59 | Source: Ambulatory Visit | Attending: Internal Medicine | Admitting: Internal Medicine

## 2015-09-11 ENCOUNTER — Encounter (HOSPITAL_COMMUNITY)
Admission: RE | Disposition: A | Discharge status: Home / Self Care | Payer: Self-pay | Source: Ambulatory Visit | Attending: Internal Medicine

## 2015-09-11 ENCOUNTER — Encounter (HOSPITAL_COMMUNITY): Payer: Self-pay | Admitting: *Deleted

## 2015-09-11 DIAGNOSIS — K5900 Constipation, unspecified: Secondary | ICD-10-CM

## 2015-09-11 DIAGNOSIS — Z83719 Family history of colon polyps, unspecified: Secondary | ICD-10-CM | POA: Insufficient documentation

## 2015-09-11 DIAGNOSIS — Z1211 Encounter for screening for malignant neoplasm of colon: Secondary | ICD-10-CM | POA: Diagnosis not present

## 2015-09-11 DIAGNOSIS — Z8371 Family history of colonic polyps: Secondary | ICD-10-CM | POA: Diagnosis not present

## 2015-09-11 DIAGNOSIS — Z79899 Other long term (current) drug therapy: Secondary | ICD-10-CM | POA: Diagnosis not present

## 2015-09-11 DIAGNOSIS — K5730 Diverticulosis of large intestine without perforation or abscess without bleeding: Secondary | ICD-10-CM | POA: Insufficient documentation

## 2015-09-11 DIAGNOSIS — K219 Gastro-esophageal reflux disease without esophagitis: Secondary | ICD-10-CM | POA: Diagnosis not present

## 2015-09-11 HISTORY — PX: COLONOSCOPY: SHX5424

## 2015-09-11 SURGERY — COLONOSCOPY
Anesthesia: Moderate Sedation

## 2015-09-11 MED ORDER — SODIUM CHLORIDE 0.9 % IV SOLN
INTRAVENOUS | Status: DC
  Administered 2015-09-11: 12:00:00 via INTRAVENOUS

## 2015-09-11 MED ORDER — ONDANSETRON HCL 4 MG/2ML IJ SOLN
INTRAMUSCULAR | Status: AC
  Filled 2015-09-11: qty 2

## 2015-09-11 MED ORDER — STERILE WATER FOR IRRIGATION IR SOLN
Status: DC | PRN
  Administered 2015-09-11: 13:00:00

## 2015-09-11 MED ORDER — MEPERIDINE HCL 100 MG/ML IJ SOLN
INTRAMUSCULAR | Status: DC | PRN
  Administered 2015-09-11: 50 mg via INTRAVENOUS
  Administered 2015-09-11: 25 mg via INTRAVENOUS

## 2015-09-11 MED ORDER — ONDANSETRON HCL 4 MG/2ML IJ SOLN
INTRAMUSCULAR | Status: DC | PRN
  Administered 2015-09-11: 4 mg via INTRAVENOUS

## 2015-09-11 MED ORDER — MEPERIDINE HCL 100 MG/ML IJ SOLN
INTRAMUSCULAR | Status: AC
  Filled 2015-09-11: qty 2

## 2015-09-11 MED ORDER — MIDAZOLAM HCL 5 MG/5ML IJ SOLN
INTRAMUSCULAR | Status: AC
  Filled 2015-09-11: qty 10

## 2015-09-11 MED ORDER — MIDAZOLAM HCL 5 MG/5ML IJ SOLN
INTRAMUSCULAR | Status: DC | PRN
  Administered 2015-09-11: 2 mg via INTRAVENOUS
  Administered 2015-09-11 (×2): 1 mg via INTRAVENOUS

## 2015-09-11 NOTE — H&P (View-Only) (Signed)
Primary Care Physician:  Purvis Kilts, MD Primary Gastroenterologist:  Dr. Gala Romney   Chief Complaint  Patient presents with  . Colonoscopy    HPI:   Lauren Glover is a 49 y.o. female presenting today to arrange screening colonoscopy. Last evaluation in 2009 without polyps, but due to family history of colon polyps, she is recommended for 5 year surveillance. History of chronic constipation.   Dealing with chronic constipation. Vague abdominal bloating. Will go several days then only have a small one. Has taken Miralax in the past but not taking now. Possible scant tissue hematochezia. No unexplained weight loss or lack of appetite. Prilosec just as needed. No dysphagia.    Past Medical History  Diagnosis Date  . GERD (gastroesophageal reflux disease)   . Diverticulitis of colon   . Constipation     Past Surgical History  Procedure Laterality Date  . Cholecystectomy    . 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 Colonoscopy  08/01/08    [id number] anal canal, otherwise, normal rectum and pancolonic diveritcula. Surveillance in 5 years due to family history of colon polyps.   Marland Kitchen Appendectomy    . Kidney stone surgery    . Esophagogastroduodenoscopy   11/10/2007    Dr. Rourk:Normal esophagus, hiatal hernia.  Small polypoid lesion in the cardia of uncertain significance, status post biopsy removal otherwise normal stomach. Benign fundic gland polyp     Current Outpatient Prescriptions  Medication Sig Dispense Refill  . albuterol (PROVENTIL HFA;VENTOLIN HFA) 108 (90 BASE) MCG/ACT inhaler Inhale 2 puffs into the lungs every 6 (six) hours as needed for wheezing.    Marland Kitchen ibuprofen (ADVIL,MOTRIN) 200 MG tablet Take 200 mg by mouth every 6 (six) hours as needed.      Marland Kitchen omeprazole (PRILOSEC) 20 MG capsule Take 20 mg by mouth daily.      . Linaclotide (LINZESS) 145 MCG CAPS capsule Take 1 capsule (145 mcg total) by mouth daily. 30 minutes before breakfast 30 capsule 3    No current facility-administered medications for this visit.    Allergies as of 08/17/2015  . (No Known Allergies)    Family History  Problem Relation Age of Onset  . Colon polyps Sister   . Colon cancer Neg Hx     Social History   Social History  . Marital Status: Single    Spouse Name: N/A  . Number of Children: N/A  . Years of Education: N/A   Occupational History  . [employer]    Social History Main Topics  . Smoking status: Never Smoker   . Smokeless tobacco: Not on file  . Alcohol Use: No  . Drug Use: No  . Sexual Activity: Yes    Birth Control/ Protection: None   Other Topics Concern  . Not on file   Social History Narrative    Review of Systems: Gen: Denies any fever, chills, fatigue, weight loss, lack of appetite.  CV: Denies chest pain, heart palpitations, peripheral edema, syncope.  Resp: Denies shortness of breath at rest or with exertion. Denies wheezing or cough.  GI: see HPI  GU : Denies urinary burning, urinary frequency, urinary hesitancy MS: +joint pain  Derm: Denies rash, itching, dry skin Psych: Denies depression, anxiety, memory loss, and confusion Heme: Denies bruising, bleeding, and enlarged lymph nodes.  Physical Exam: BP 131/84 mmHg  Pulse 80  Temp(Src) 97.9 F (36.6 C) (Oral)  Ht 5\' 1"  (1.549 m)  Wt  169 lb 6.4 oz (76.839 kg)  BMI 32.02 kg/m2  LMP 08/17/2015 General:   Alert and oriented. Pleasant and cooperative. Well-nourished and well-developed.  Head:  Normocephalic and atraumatic. Eyes:  Without icterus, sclera clear and conjunctiva pink.  Ears:  Normal auditory acuity. Nose:  No deformity, discharge,  or lesions. Mouth:  No deformity or lesions, oral mucosa pink.  Lungs:  Clear to auscultation bilaterally. No wheezes, rales, or rhonchi. No distress.  Heart:  S1, S2 present without murmurs appreciated.  Abdomen:  +BS, soft, non-tender and non-distended. No HSM noted. No guarding or rebound. No masses appreciated.   Rectal:  Deferred  Msk:  Symmetrical without gross deformities. Normal posture. Extremities:  Without clubbing or edema. Neurologic:  Alert and  oriented x4;  grossly normal neurologically. Skin:  Intact without significant lesions or rashes. Psych:  Alert and cooperative. Normal mood and affect.

## 2015-09-11 NOTE — Op Note (Signed)
Lauren Glover, [id number]   COLONOSCOPY PROCEDURE REPORT  PATIENT: Lauren Glover, Lauren Glover  MR#: [id number] BIRTHDATE: [birth year] , 49  yrs. old GENDER: female ENDOSCOPIST: R.  Garfield Cornea, MD FACP [facility] REFERRED [id number] Hilma Favors, M.D. PROCEDURE DATE:  Sep 25, 2015 PROCEDURE:   Colonoscopy, screening INDICATIONS:high-risk screening examination; family history of colon polyps. MEDICATIONS: Versed 4 mg IV and Demerol 75 mg IV in divided doses. Zofran 4 mg IV. ASA CLASS:       Class II  CONSENT: The risks, benefits, alternatives and imponderables including but not limited to bleeding, perforation as well as the possibility of a missed lesion have been reviewed.  The potential for biopsy, lesion removal, etc. have also been discussed. Questions have been answered.  All parties agreeable.  Please see the history and physical in the medical record for more information.  DESCRIPTION OF PROCEDURE:   After the risks benefits and alternatives of the procedure were thoroughly explained, informed consent was obtained.  The digital rectal exam revealed no abnormalities of the rectum.   The [id number]  endoscope was introduced through the anus and advanced to the terminal ileum which was intubated for a short distance. No adverse events experienced.   The quality of the prep was adequate  The instrument was then slowly withdrawn as the colon was fully examined. Estimated blood loss is zero unless otherwise noted in this procedure report.      COLON FINDINGS: Normal-appearing rectum.  Scattered colonic diverticula?"predominantly right-sided; otherwise, the remainder of the colonic mucosa appeared normal.  The distal 10 cm of terminal ileal mucosa also appeared normal.  Retroflexion was performed. .  Withdrawal time=6 minutes 0 seconds.  The scope was withdrawn and the procedure completed. COMPLICATIONS: There were no immediate  complications.  ENDOSCOPIC IMPRESSION: Colonic diverticulosis  RECOMMENDATIONS: Begin Benefiber 1 tablespoon twice daily.   Take Linzess 145 daily to every other day as needed for bowel function. Repeat screening colonoscopy in 5 years  eSigned:  R. Garfield Cornea, MD Rosalita [facility] 2015-09-25 1:14 PM   cc:  CPT CODES: ICD CODES:  The ICD and CPT codes recommended by this software are interpretations from the data that the clinical staff has captured with the software.  The verification of the translation of this report to the ICD and CPT codes and modifiers is the sole responsibility of the health care institution and practicing physician where this report was generated.  [employer] will not be held responsible for the validity of the ICD and CPT codes included on this report.  AMA assumes no liability for data contained or not contained herein. CPT is a Designer, television/film set of the [employer].  PATIENT NAME:  Lauren Glover, Lauren Glover MR#: [id number]

## 2015-09-11 NOTE — Interval H&P Note (Signed)
History and Physical Interval Note:  09/11/2015 12:39 PM  Lauren Glover  has presented today for surgery, with the diagnosis of Screening  The various methods of treatment have been discussed with the patient and family. After consideration of risks, benefits and other options for treatment, the patient has consented to  Procedure(s) with comments: COLONOSCOPY (N/A) - 0915 - moved to 9:30 - office to notify as a surgical intervention .  The patient's history has been reviewed, patient examined, no change in status, stable for surgery.  I have reviewed the patient's chart and labs.  Questions were answered to the patient's satisfaction.     Lauren Glover  No change; survervellance tcs per plan.  The risks, benefits, limitations, alternatives and imponderables have been reviewed with the patient. Questions have been answered. All parties are agreeable.

## 2015-09-11 NOTE — Discharge Instructions (Signed)
°  Colonoscopy Discharge Instructions  Read the instructions outlined below and refer to this sheet in the next few weeks. These discharge instructions provide you with general information on caring for yourself after you leave the hospital. Your doctor may also give you specific instructions. While your treatment has been planned according to the most current medical practices available, unavoidable complications occasionally occur. If you have any problems or questions after discharge, call Dr. Gala Romney at [phone]. ACTIVITY  You may resume your regular activity, but move at a slower pace for the next 24 hours.   Take frequent rest periods for the next 24 hours.   Walking will help get rid of the air and reduce the bloated feeling in your belly (abdomen).   No driving for 24 hours (because of the medicine (anesthesia) used during the test).    Do not sign any important legal documents or operate any machinery for 24 hours (because of the anesthesia used during the test).  NUTRITION  Drink plenty of fluids.   You may resume your normal diet as instructed by your doctor.   Begin with a light meal and progress to your normal diet. Heavy or fried foods are harder to digest and may make you feel sick to your stomach (nauseated).   Avoid alcoholic beverages for 24 hours or as instructed.  MEDICATIONS  You may resume your normal medications unless your doctor tells you otherwise.  WHAT YOU CAN EXPECT TODAY  Some feelings of bloating in the abdomen.   Passage of more gas than usual.   Spotting of blood in your stool or on the toilet paper.  IF YOU HAD POLYPS REMOVED DURING THE COLONOSCOPY:  No aspirin products for 7 days or as instructed.   No alcohol for 7 days or as instructed.   Eat a soft diet for the next 24 hours.  FINDING OUT THE RESULTS OF YOUR TEST Not all test results are available during your visit. If your test results are not back during the visit, make an appointment  with your caregiver to find out the results. Do not assume everything is normal if you have not heard from your caregiver or the medical facility. It is important for you to follow up on all of your test results.  SEEK IMMEDIATE MEDICAL ATTENTION IF:  You have more than a spotting of blood in your stool.   Your belly is swollen (abdominal distention).   You are nauseated or vomiting.   You have a temperature over 101.   You have abdominal pain or discomfort that is severe or gets worse throughout the day.    Diverticulosis and constipation information provided  Begin Benefiber 1 tablespoon twice daily  Use Linzess every day to every other day as needed for constipation  Repeat colonoscopy in 5 years

## 2015-09-13 ENCOUNTER — Encounter (HOSPITAL_COMMUNITY): Payer: Self-pay | Admitting: Internal Medicine

## 2016-01-09 NOTE — H&P (Signed)
Lauren Glover  DICTATION # [id number] CSN# [id number]   Lauren Asal, MD 01/09/2016 8:45 AM

## 2016-01-10 NOTE — H&P (Signed)
NAMESTOREE, VETTEL NO.:  [id number]  MEDICAL RECORD NO.:  [id number]  LOCATION:                                FACILITY:  [facility]  PHYSICIAN:  Ralene Bathe. Matthew Saras, M.D.DATE OF BIRTH:  [birth year]  DATE OF ADMISSION:  01/25/2016 DATE OF DISCHARGE:                             HISTORY & PHYSICAL   CHIEF COMPLAINT:  Menorrhagia with symptomatic fibroid.  HPI:  A 50 year old, G2, P0, is not currently sexually active, presents with history of significant menorrhagia that has worsened over the last 6 months.  Ultrasound evaluation in our office, March 17, demonstrated 2.8 cm posterior fibroid that was partially submucosal.  We discussed number of treatment options, the problem is significant enough that she prefers to proceed with definitive hysterectomy.  The procedure of LAVH with bilateral salpingectomy reviewed with her including specific risks related to bleeding, infection, transfusion, adjacent organ injury, wound infection, phlebitis, the possible need to complete the surgery by open technique, all reviewed with her.  PAST MEDICAL HISTORY:  Allergies:  None.  CURRENT MEDICATIONS:  Just vitamins.  SURGICAL HISTORY:  She has had a tumor removed from her left calf, cholecystectomy, appendectomy, lithotripsy.  REVIEW OF SYSTEMS:  Significant for hiatal hernia, IBS, diverticulosis.  FAMILY HISTORY:  Significant for headache, asthma, hepatitis, anemia, diverticulosis, hypertension, and breast cancer.  SOCIAL HISTORY:  Denies alcohol, tobacco, or drug use.  She is single. Last Pap November 2016, was normal.  PHYSICAL EXAM:  VITAL SIGNS:  Temp 98.2, BP 112/80. HEENT:  Unremarkable. NECK:  Supple without masses. LUNGS:  Clear. CARDIOVASCULAR:  Regular rate and rhythm without murmurs, rubs, or gallops. BREASTS:  Without masses. ABDOMEN:  Soft, flat, nontender.  Vulva vagina, cervix normal.  Uterus mid position, upper limit of normal size, mobile.  Adnexa  negative. EXTREMITIES:  Unremarkable. NEUROLOGIC:  Unremarkable.  IMPRESSION:  Symptomatic leiomyoma with menorrhagia.  PLAN:  LAVH bilateral salpingectomy procedure and risks discussed as above.     Holly Iannaccone M. Matthew Saras, M.D.     [facility]/MEDQ  D:  01/09/2016  T:  01/09/2016  Job:  [id number]

## 2016-01-18 ENCOUNTER — Encounter (HOSPITAL_BASED_OUTPATIENT_CLINIC_OR_DEPARTMENT_OTHER): Payer: Self-pay | Admitting: *Deleted

## 2016-01-25 ENCOUNTER — Ambulatory Visit (HOSPITAL_BASED_OUTPATIENT_CLINIC_OR_DEPARTMENT_OTHER): Admit: 2016-01-25 | Payer: 59 | Admitting: Obstetrics and Gynecology

## 2016-01-25 ENCOUNTER — Encounter (HOSPITAL_BASED_OUTPATIENT_CLINIC_OR_DEPARTMENT_OTHER): Payer: Self-pay

## 2016-01-25 HISTORY — DX: Leiomyoma of uterus, unspecified: D25.9

## 2016-01-25 HISTORY — DX: Other constipation: K59.09

## 2016-01-25 HISTORY — DX: Excessive and frequent menstruation with regular cycle: N92.0

## 2016-01-25 HISTORY — DX: Diaphragmatic hernia without obstruction or gangrene: K44.9

## 2016-01-25 HISTORY — DX: Personal history of urinary calculi: Z87.442

## 2016-01-25 SURGERY — HYSTERECTOMY, VAGINAL, LAPAROSCOPY-ASSISTED, WITH SALPINGECTOMY
Anesthesia: General | Laterality: Bilateral

## 2016-08-02 ENCOUNTER — Encounter (HOSPITAL_COMMUNITY): Payer: Self-pay | Admitting: *Deleted

## 2016-08-02 ENCOUNTER — Ambulatory Visit (HOSPITAL_COMMUNITY)
Admission: EM | Admit: 2016-08-02 | Discharge: 2016-08-02 | Disposition: A | Discharge status: Home / Self Care | Payer: 59 | Attending: Family Medicine | Admitting: Family Medicine

## 2016-08-02 DIAGNOSIS — R102 Pelvic and perineal pain: Secondary | ICD-10-CM

## 2016-08-02 DIAGNOSIS — R5383 Other fatigue: Secondary | ICD-10-CM | POA: Insufficient documentation

## 2016-08-02 DIAGNOSIS — E559 Vitamin D deficiency, unspecified: Secondary | ICD-10-CM | POA: Insufficient documentation

## 2016-08-02 DIAGNOSIS — M2550 Pain in unspecified joint: Secondary | ICD-10-CM | POA: Insufficient documentation

## 2016-08-02 LAB — POCT URINALYSIS DIP (DEVICE)
BILIRUBIN URINE: NEGATIVE
Glucose, UA: NEGATIVE mg/dL
KETONES UR: NEGATIVE mg/dL
Leukocytes, UA: NEGATIVE
Nitrite: NEGATIVE
PH: 5.5 (ref 5.0–8.0)
PROTEIN: NEGATIVE mg/dL
Urobilinogen, UA: 0.2 mg/dL (ref 0.0–1.0)

## 2016-08-02 MED ORDER — MELOXICAM 15 MG PO TABS: 15.0000 mg | ORAL_TABLET | Freq: Every day | ORAL | 0 refills | Status: DC

## 2016-08-02 NOTE — Progress Notes (Signed)
*IMAGE* Office Visit Note  Patient: Lauren Glover             Date of Birth: 1966/03/07           MRN: [id number]             PCP: Purvis Kilts, MD Referring: Sharilyn Sites, MD Visit Date: 08/05/2016 Occupation: Healthcare billing specialist    Subjective:  Pain in hands   History of Present Illness: KOA PALLA is a 50 y.o. female . She states she has had joint pain on and off for 4-5 years now. She reports a stiffness in her hands and toes for 3-4 years. She reports pain in her bilateral knee joints with climbing the stairs. She denies any joint swelling. She has had problems with TMJ pain over the last 4-5 months. She states she was treated with prednisone about 5 months ago which helped her TMJ symptoms and then the symptoms recurred. She's also had fatigue for multiple years which has increased recently. She was seen by Dr. Armandina Gemma her primary care physician who did lab work and was positive for rheumatoid factor for this reason she was referred to me.  Activities of Daily Living:  Patient reports morning stiffness for 15 minutes.   Patient Reports nocturnal pain.  Difficulty dressing/grooming: Denies Difficulty climbing stairs: Reports Difficulty getting out of chair: Reports Difficulty using hands for taps, buttons, cutlery, and/or writing: Reports   Review of Systems  Constitutional: Positive for fatigue and weight gain. Negative for night sweats and weakness.  HENT: Negative for mouth sores, trouble swallowing, trouble swallowing, mouth dryness and nose dryness.   Eyes: Negative for pain, redness, visual disturbance and dryness.  Respiratory: Negative for cough, shortness of breath and difficulty breathing.   Cardiovascular: Negative for chest pain, palpitations, hypertension, irregular heartbeat and swelling in legs/feet.  Gastrointestinal: Negative for blood in stool, constipation and diarrhea.  Endocrine: Negative for increased urination.  Genitourinary: Negative  for vaginal dryness.  Musculoskeletal: Positive for arthralgias, joint pain and morning stiffness. Negative for joint swelling, myalgias, muscle weakness, muscle tenderness and myalgias.  Skin: Negative for color change, rash, hair loss, skin tightness, ulcers and sensitivity to sunlight.  Allergic/Immunologic: Negative for susceptible to infections.  Neurological: Negative for dizziness, memory loss and night sweats.  Hematological: Negative for swollen glands.  Psychiatric/Behavioral: Negative for depressed mood and sleep disturbance. The patient is not nervous/anxious.     PMFS History:  Patient Active Problem List   Diagnosis Date Noted  . Gastroesophageal reflux 08/05/2016  . History of kidney stones 08/05/2016  . Asthma 08/05/2016  . Arthralgia 08/02/2016  . Vitamin D deficiency 08/02/2016  . Fatigue 08/02/2016  . Family history of polyps in the colon   . Diverticulosis of colon without hemorrhage   . Encounter for screening colonoscopy 08/17/2015  . Constipation 08/17/2015    Past Medical History:  Diagnosis Date  . Chronic constipation   . Diverticulitis of colon   . GERD (gastroesophageal reflux disease)   . Hiatal hernia   . History of kidney stones   . Menorrhagia   . Uterine fibroid     Family History  Problem Relation Age of Onset  . Colon polyps Sister   . High blood pressure Sister   . High blood pressure Mother   . High blood pressure Brother   . Lupus Sister   . Colon cancer Neg Hx    Past Surgical History:  Procedure Laterality Date  . COLONOSCOPY N/A  09/11/2015   Procedure: COLONOSCOPY;  Surgeon: Daneil Dolin, MD;  Location: [facility];  Service: Endoscopy;  Laterality: N/A;  . ESOPHAGOGASTRODUODENOSCOPY   11/10/2007   Dr. Rourk:Normal esophagus, hiatal hernia.  Small polypoid lesion in the cardia of uncertain significance, status post biopsy removal otherwise normal stomach. Benign fundic gland polyp   . EXTRACORPOREAL SHOCK WAVE LITHOTRIPSY  Left 04-30-2010  . LAPAROSCOPIC APPENDECTOMY  07-04-2009  . LAPAROSCOPIC CHOLECYSTECTOMY    . REMOVAL BENIGN RUMOR LEFT CALF     Social History   Social History Narrative  . No narrative on file     Objective: Vital Signs: BP 115/72 (BP Location: Left Arm, Patient Position: Sitting, Cuff Size: Large)   Pulse 77   Resp 12   Ht '5\' 1"'$  (1.549 m)   Wt 171 lb (77.6 kg)   LMP 07/29/2016   BMI 32.31 kg/m    Physical Exam  Constitutional: She is oriented to person, place, and time. She appears well-developed and well-nourished.  HENT:  Head: Normocephalic and atraumatic.  Eyes: Conjunctivae and EOM are normal.  Neck: Normal range of motion.  Cardiovascular: Normal rate, regular rhythm, normal heart sounds and intact distal pulses.   Pulmonary/Chest: Effort normal and breath sounds normal.  Abdominal: Soft. Bowel sounds are normal.  Lymphadenopathy:    She has no cervical adenopathy.  Neurological: She is alert and oriented to person, place, and time.  Skin: Skin is warm and dry. Capillary refill takes less than 2 seconds.  Surgical scar on left calf  Psychiatric: She has a normal mood and affect. Her behavior is normal.  Nursing note and vitals reviewed.    Musculoskeletal Exam: She had mild TMJ tenderness bilaterally. C-spine, thoracic spine, lumbar spine were all good range of motion. She had no SI joint tenderness. Shoulder joints elbow joints wrist joints MCPs PIPs DIPs with good range of motion with no synovitis. She had no tenderness on examination. Hip joints knee joints ankles MTPs PIPs were also good range of motion. She has discomfort with range of motion of her bilateral knee joints. No synovitis was noted in on her MTPs PIPs or DIPs. She had pes planus on examination.  CDAI Exam: CDAI Homunculus Exam:   Joint Counts:  CDAI Tender Joint count: 0 CDAI Swollen Joint count: 0  Global Assessments:  Patient Global Assessment: 5 Provider Global Assessment: 4  CDAI  Calculated Score: 9    Investigation: Findings:  05/01/2016 CBC WBC 11.2, CMP normal, TSH normal, ESR 7, rheumatoid factor 40.4    Imaging: Xr Foot 2 Views Left  Result Date: 08/05/2016 Narrowing of the first MTP joint with hallux valgus, DIP narrowing, a small calcaneal spur, dorsal spurring was noted. 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  Xr Foot 2 Views Right  Result Date: 08/05/2016 Narrowing of the first MTP joint with hallux valgus, DIP narrowing, a small calcaneal spur, dorsal spurring was noted. 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  Xr Hand 2 View Left  Result Date: 08/05/2016 Minimal PIP and CMC narrowing. No MCP or intercarpal joint narrowing or erosive changes were noted. Impression: Mild osteoarthritic changes were noted.  Xr Hand 2 View Right  Result Date: 08/05/2016 Minimal PIP and CMC narrowing. No MCP or intercarpal joint narrowing or erosive changes were noted. Impression: Mild osteoarthritic changes were noted.  Xr Knee 3 View Left  Result Date: 08/05/2016 Moderate medial compartment narrowing, moderate patellofemoral narrowing, no chondrocalcinosis. Impression:  Moderate osteoarthritis of left knee joint which is more prominent than the right knee joint. Moderate chondromalacia patella.  Xr Knee 3 View Right  Result Date: 08/05/2016 Moderate medial compartment narrowing, moderate patellofemoral narrowing, no chondrocalcinosis. She has some prominence of tibial tuberosity. Impression: These findings were consistent with moderate medial compartment narrowing, moderate chondromalacia patella.   Speciality Comments: No specialty comments available.    Procedures:  No procedures performed Allergies: Patient has no known allergies.   Assessment / Plan: Visit Diagnoses:    Arthralgia, unspecified joint - Positive rheumatoid factor. Patient has no synovitis on examination  she complains of tenderness in her hands, feet and knee joints. She also complains of TMJ tenderness. She had mild tenderness in her PIPs of her hands and feet. She also has discomfort with range of motion of her knee joints without any warmth swelling or effusion. I will obtain x-rays of her hands feet and knee joints today to evaluate this further. I will also obtain CK, CCP, 14 33 eta, uric acid and G6PD. I will schedule ultrasound of her bilateral hands and bilateral feet to evaluate this further.  Fatigue, unspecified type: She gives history of chronic fatigue which is increased recently.  Vitamin D deficiency which is being treated in the past.  Her other medical problems include history of diverticulosis there is an episode of diverticulitis reported. She has history of forearm reflux, renal calcinosis, asthma which is being controlled and she is being seen by PCP for that.  Orders:      Face-to-face time spent with patient was 65mnutes. 50% of time was spent in counseling and coordination of care.  Follow-Up Instructions: Return for Pain hands. Next available new patient follow-up   SBo Merino MD, FJulious Payer

## 2016-08-02 NOTE — ED Triage Notes (Signed)
Pt  Reports   abd  Pain    And  Back pain    Vaginal  Itching        No    Bleeding  denys  Any   Discharge     Pt  Reports     Some  Frequent urination     Pt reports   Some  Nausea  As   Well         Pt  Ambulated  With a  Steady  Fluid  Gait

## 2016-08-02 NOTE — Discharge Instructions (Signed)
See your doctor for further eval. °

## 2016-08-02 NOTE — ED Provider Notes (Signed)
[place]    CSN: [id number] Arrival date & time: 08/02/16  1556     History   Chief Complaint Chief Complaint  Patient presents with  . Abdominal Pain    HPI Lauren Glover is a 50 y.o. female.   The history is provided by the patient.  Abdominal Pain  Pain location:  Suprapubic Pain quality: burning   Pain severity:  Mild Onset quality:  Gradual Duration:  3 days Progression:  Worsening Chronicity:  New Relieved by:  None tried Worsened by:  Nothing Ineffective treatments:  None tried Associated symptoms: dysuria and nausea   Associated symptoms: no cough, no diarrhea, no fever, no vaginal discharge and no vomiting     Past Medical History:  Diagnosis Date  . Chronic constipation   . Diverticulitis of colon   . GERD (gastroesophageal reflux disease)   . Hiatal hernia   . History of kidney stones   . Menorrhagia   . Uterine fibroid     Patient Active Problem List   Diagnosis Date Noted  . Arthralgia 08/02/2016  . Vitamin D deficiency 08/02/2016  . Fatigue 08/02/2016  . Family history of polyps in the colon   . Diverticulosis of colon without hemorrhage   . Encounter for screening colonoscopy 08/17/2015  . Constipation 08/17/2015    Past Surgical History:  Procedure Laterality Date  . COLONOSCOPY N/A 09/11/2015   Procedure: COLONOSCOPY;  Surgeon: Daneil Dolin, MD;  Location: [facility];  Service: Endoscopy;  Laterality: N/A;  . ESOPHAGOGASTRODUODENOSCOPY   11/10/2007   Dr. Rourk:Normal esophagus, hiatal hernia.  Small polypoid lesion in the cardia of uncertain significance, status post biopsy removal otherwise normal stomach. Benign fundic gland polyp   . EXTRACORPOREAL SHOCK WAVE LITHOTRIPSY Left 04-30-2010  . LAPAROSCOPIC APPENDECTOMY  07-04-2009  . LAPAROSCOPIC CHOLECYSTECTOMY    . REMOVAL BENIGN RUMOR LEFT CALF      OB History    No data available       Home Medications    Prior to Admission medications   Medication  Sig Start Date End Date Taking? Authorizing Provider  Linaclotide (LINZESS) 145 MCG CAPS capsule Take 1 capsule (145 mcg total) by mouth daily. 30 minutes before breakfast 08/17/15  Yes Annitta Needs, NP  albuterol (PROVENTIL HFA;VENTOLIN HFA) 108 (90 BASE) MCG/ACT inhaler Inhale 2 puffs into the lungs every 6 (six) hours as needed for wheezing.    Historical Provider, MD  ibuprofen (ADVIL,MOTRIN) 200 MG tablet Take 200 mg by mouth every 6 (six) hours as needed.      Historical Provider, MD  meloxicam (MOBIC) 15 MG tablet Take 1 tablet (15 mg total) by mouth daily. 08/02/16   Billy Fischer, MD  omeprazole (PRILOSEC) 20 MG capsule Take 20 mg by mouth daily.      Historical Provider, MD    Family History Family History  Problem Relation Age of Onset  . Colon polyps Sister   . Colon cancer Neg Hx     Social History Social History  Substance Use Topics  . Smoking status: Never Smoker  . Smokeless tobacco: Not on file  . Alcohol use No     Allergies   Review of patient's allergies indicates no known allergies.   Review of Systems Review of Systems  Constitutional: Negative.  Negative for fever.  Respiratory: Negative for cough.   Gastrointestinal: Positive for abdominal pain and nausea. Negative for diarrhea and vomiting.  Genitourinary: Positive for dysuria, frequency, pelvic pain and  urgency. Negative for difficulty urinating, dyspareunia, flank pain and vaginal discharge.  All other systems reviewed and are negative.    Physical Exam Triage Vital Signs ED Triage Vitals  Enc Vitals Group     BP 08/02/16 1645 129/81     Pulse Rate 08/02/16 1645 68     Resp 08/02/16 1645 18     Temp 08/02/16 1645 98 F (36.7 C)     Temp Source 08/02/16 1645 Oral     SpO2 08/02/16 1645 100 %     Weight --      Height --      Head Circumference --      Peak Flow --      Pain Score 08/02/16 1652 3     Pain Loc --      Pain Edu? --      Excl. in [place]? --    No data found.   Updated  Vital Signs BP 129/81 (BP Location: Right Arm)   Pulse 68   Temp 98 F (36.7 C) (Oral)   Resp 18   SpO2 100%   Visual Acuity Right Eye Distance:   Left Eye Distance:   Bilateral Distance:    Right Eye Near:   Left Eye Near:    Bilateral Near:     Physical Exam  Constitutional: She appears well-developed and well-nourished. No distress.  Cardiovascular: Normal rate, regular rhythm, normal heart sounds and intact distal pulses.   Pulmonary/Chest: Effort normal and breath sounds normal.  Abdominal: Soft. Bowel sounds are normal. She exhibits no distension and no mass. There is tenderness in the suprapubic area. There is no rebound and no guarding.    Nursing note and vitals reviewed.    UC Treatments / Results  Labs (all labs ordered are listed, but only abnormal results are displayed) Labs Reviewed  POCT URINALYSIS DIP (DEVICE) - Abnormal; Notable for the following:       Result Value   Hgb urine dipstick TRACE (*)    All other components within normal limits   U/a neg.  EKG  EKG Interpretation None       Radiology No results found.  Procedures Procedures (including critical care time)  Medications Ordered in UC Medications - No data to display   Initial Impression / Assessment and Plan / UC Course  I have reviewed the triage vital signs and the nursing notes.  Pertinent labs & imaging results that were available during my care of the patient were reviewed by me and considered in my medical decision making (see chart for details).  Clinical Course      Final Clinical Impressions(s) / UC Diagnoses   Final diagnoses:  Pelvic pain in female    New Prescriptions New Prescriptions   MELOXICAM (MOBIC) 15 MG TABLET    Take 1 tablet (15 mg total) by mouth daily.     Billy Fischer, MD 08/02/16 [phone]

## 2016-08-05 ENCOUNTER — Ambulatory Visit (INDEPENDENT_AMBULATORY_CARE_PROVIDER_SITE_OTHER): Payer: 59

## 2016-08-05 ENCOUNTER — Encounter: Payer: Self-pay | Admitting: Rheumatology

## 2016-08-05 ENCOUNTER — Ambulatory Visit (INDEPENDENT_AMBULATORY_CARE_PROVIDER_SITE_OTHER): Payer: 59 | Admitting: Radiology

## 2016-08-05 ENCOUNTER — Ambulatory Visit (INDEPENDENT_AMBULATORY_CARE_PROVIDER_SITE_OTHER): Payer: 59 | Admitting: Rheumatology

## 2016-08-05 VITALS — BP 115/72 | HR 77 | Resp 12 | Ht 61.0 in | Wt 171.0 lb

## 2016-08-05 DIAGNOSIS — M79672 Pain in left foot: Secondary | ICD-10-CM

## 2016-08-05 DIAGNOSIS — M25561 Pain in right knee: Secondary | ICD-10-CM

## 2016-08-05 DIAGNOSIS — M79642 Pain in left hand: Secondary | ICD-10-CM

## 2016-08-05 DIAGNOSIS — J45909 Unspecified asthma, uncomplicated: Secondary | ICD-10-CM | POA: Diagnosis not present

## 2016-08-05 DIAGNOSIS — M79641 Pain in right hand: Secondary | ICD-10-CM

## 2016-08-05 DIAGNOSIS — R5383 Other fatigue: Secondary | ICD-10-CM | POA: Diagnosis not present

## 2016-08-05 DIAGNOSIS — K219 Gastro-esophageal reflux disease without esophagitis: Secondary | ICD-10-CM

## 2016-08-05 DIAGNOSIS — M79671 Pain in right foot: Secondary | ICD-10-CM

## 2016-08-05 DIAGNOSIS — G8929 Other chronic pain: Secondary | ICD-10-CM

## 2016-08-05 DIAGNOSIS — Z87442 Personal history of urinary calculi: Secondary | ICD-10-CM | POA: Diagnosis not present

## 2016-08-05 DIAGNOSIS — M25562 Pain in left knee: Secondary | ICD-10-CM

## 2016-08-05 DIAGNOSIS — M2550 Pain in unspecified joint: Secondary | ICD-10-CM

## 2016-08-05 DIAGNOSIS — E559 Vitamin D deficiency, unspecified: Secondary | ICD-10-CM

## 2016-08-05 LAB — CK: Total CK: 89 U/L (ref 7–177)

## 2016-08-05 LAB — URIC ACID: URIC ACID, SERUM: 5.2 mg/dL (ref 2.5–7.0)

## 2016-08-06 LAB — CYCLIC CITRUL PEPTIDE ANTIBODY, IGG: Cyclic Citrullin Peptide Ab: <16 Units

## 2016-08-07 LAB — GLUCOSE 6 PHOSPHATE DEHYDROGENASE: G-6PDH: 10.4 U/g{Hb} (ref 4.6–13.5)

## 2016-08-09 LAB — 14-3-3 ETA PROTEIN: 14-3-3 ETA PROTEIN: 0.9 ng/mL — AB (ref <0.2)

## 2016-08-14 ENCOUNTER — Ambulatory Visit: Payer: 59 | Admitting: Nurse Practitioner

## 2016-08-21 ENCOUNTER — Encounter: Payer: Self-pay | Admitting: Rheumatology

## 2016-08-21 ENCOUNTER — Inpatient Hospital Stay (INDEPENDENT_AMBULATORY_CARE_PROVIDER_SITE_OTHER): Payer: 59

## 2016-08-21 ENCOUNTER — Ambulatory Visit (INDEPENDENT_AMBULATORY_CARE_PROVIDER_SITE_OTHER): Payer: 59 | Admitting: Rheumatology

## 2016-08-21 DIAGNOSIS — M79642 Pain in left hand: Secondary | ICD-10-CM

## 2016-08-21 DIAGNOSIS — M79672 Pain in left foot: Secondary | ICD-10-CM

## 2016-08-21 DIAGNOSIS — M79671 Pain in right foot: Secondary | ICD-10-CM

## 2016-08-21 DIAGNOSIS — M79641 Pain in right hand: Secondary | ICD-10-CM

## 2016-08-21 NOTE — Progress Notes (Signed)
   Procedure Note  Patient: Lauren Glover             Date of Birth: 03-Aug-1966           MRN: [id number]             Visit Date: 08/21/2016  Procedures: Visit Diagnoses: Pain in both hands - Plan: [place] Extrem Up Bilat Comp  Pain in both feet - Plan: [place] Extrem Low Bilat Ltd  No procedures performed   Patient came today to get ultrasound examination of bilateral upper extremities and bilateral lower extremities for ongoing pain and discomfort in her hands. [place] Extrem Low Bilat Ltd  Result Date: 08/21/2016 Chief complaint: pain in bilateral feet Per EULAR recommendation, using 12 MHz transducer, grayscale and power Doppler ultrasound examination of bilateral feet was performed. We evaluated bilateral first second and fourth fifth MTPs both dorsal and plantar aspect bilateral tibiotalar joints. There was no synovitis or tenosynovitis noted. She had narrowing of bilateral first MTP joints. Impression: The ultrasound examination did not show any synovitis or  tenosynovitis. These findings were consistent with osteoarthritis.  [place] Extrem Up Bilat Comp  Result Date: 08/21/2016 Ultrasound examination of bilateral hands was performed per EULAR recommendations. Using 12 MHz transducer, grayscale and power Doppler bilateral second, third, and fifth MCP joints and bilateral wrist joints both dorsal and volar aspects were evaluated to look for synovitis or tenosynovitis. The findings were there was  synovitis in bilateral third MCP joints and right wrist joint  on ultrasound examination. Her right median nerve was 0. 09 cm squares which was upper limits of normal and left median nerve was 0.08 cm squares which was within normal limits. Impression: Ultrasound examination of bilateral hands was consistent with inflammatory arthritis

## 2016-09-04 DIAGNOSIS — M79643 Pain in unspecified hand: Secondary | ICD-10-CM | POA: Insufficient documentation

## 2016-09-04 NOTE — Progress Notes (Signed)
Office Visit Note  Patient: Lauren Glover             Date of Birth: 07-18-1966           MRN: [id number]             PCP: Purvis Kilts, MD Referring: Sharilyn Sites, MD Visit Date: 09/06/2016 Occupation: Healthcare billing specialist    Subjective:  Hand pain   History of Present Illness: Lauren Glover is a 50 y.o. female with history of multiple arthralgias. She continues to have some pain and discomfort in her hands. She denies any joint swelling currently. She continues to have discomfort in her bilateral knee joints and her feet she states this morning she was having a lot of pain in her right foot.  Activities of Daily Living:  Patient reports morning stiffness for 10 minutes.   Patient Denies nocturnal pain.  Difficulty dressing/grooming: Denies Difficulty climbing stairs: Denies Difficulty getting out of chair: Reports Difficulty using hands for taps, buttons, cutlery, and/or writing: Reports   Review of Systems  Constitutional: Positive for fatigue. Negative for night sweats, weight gain, weight loss and weakness.  HENT: Negative for mouth sores, trouble swallowing, trouble swallowing, mouth dryness and nose dryness.   Eyes: Negative for pain, redness, visual disturbance and dryness.  Respiratory: Negative for cough, shortness of breath and difficulty breathing.   Cardiovascular: Negative for chest pain, palpitations, hypertension, irregular heartbeat and swelling in legs/feet.  Gastrointestinal: Negative for blood in stool, constipation and diarrhea.  Endocrine: Negative for increased urination.  Genitourinary: Negative for vaginal dryness.  Musculoskeletal: Positive for arthralgias, joint pain, joint swelling and morning stiffness. Negative for myalgias, muscle weakness, muscle tenderness and myalgias.  Skin: Negative for color change, rash, hair loss, skin tightness, ulcers and sensitivity to sunlight.  Allergic/Immunologic: Negative for susceptible to  infections.  Neurological: Negative for dizziness, memory loss and night sweats.  Hematological: Negative for swollen glands.  Psychiatric/Behavioral: Negative for depressed mood and sleep disturbance. The patient is not nervous/anxious.     PMFS History:  Patient Active Problem List   Diagnosis Date Noted  . Rheumatoid arthritis with rheumatoid factor 09/05/2016  . Primary osteoarthritis of both knees 09/05/2016  . Primary osteoarthritis of both hands 09/05/2016  . Primary osteoarthritis of both feet 09/05/2016  . Hand pain 09/04/2016  . Gastroesophageal reflux 08/05/2016  . History of kidney stones 08/05/2016  . Asthma 08/05/2016  . Arthralgia 08/02/2016  . Vitamin D deficiency 08/02/2016  . Fatigue 08/02/2016  . Family history of polyps in the colon   . Diverticulosis of colon without hemorrhage   . Encounter for screening colonoscopy 08/17/2015  . Constipation 08/17/2015    Past Medical History:  Diagnosis Date  . Chronic constipation   . Diverticulitis of colon   . GERD (gastroesophageal reflux disease)   . Hiatal hernia   . History of kidney stones   . Menorrhagia   . Uterine fibroid     Family History  Problem Relation Age of Onset  . Colon polyps Sister   . High blood pressure Sister   . High blood pressure Mother   . High blood pressure Brother   . Lupus Sister   . Colon cancer Neg Hx    Past Surgical History:  Procedure Laterality Date  . COLONOSCOPY N/A 09/11/2015   Procedure: COLONOSCOPY;  Surgeon: Daneil Dolin, MD;  Location: [facility];  Service: Endoscopy;  Laterality: N/A;  . ESOPHAGOGASTRODUODENOSCOPY   11/10/2007  Dr. Rourk:Normal esophagus, hiatal hernia.  Small polypoid lesion in the cardia of uncertain significance, status post biopsy removal otherwise normal stomach. Benign fundic gland polyp   . EXTRACORPOREAL SHOCK WAVE LITHOTRIPSY Left 04-30-2010  . LAPAROSCOPIC APPENDECTOMY  07-04-2009  . LAPAROSCOPIC CHOLECYSTECTOMY    . REMOVAL  BENIGN RUMOR LEFT CALF     Social History   Social History Narrative  . No narrative on file     Objective: Vital Signs: BP 130/81 (BP Location: Left Arm, Patient Position: Sitting, Cuff Size: Normal)   Pulse 75   Resp 14   Ht 5' 1.25" (1.556 m)   Wt 179 lb (81.2 kg)   LMP 09/06/2016   BMI 33.55 kg/m    Physical Exam  Constitutional: She is oriented to person, place, and time. She appears well-developed and well-nourished.  HENT:  Head: Normocephalic and atraumatic.  Eyes: Conjunctivae and EOM are normal.  Neck: Normal range of motion.  Cardiovascular: Normal rate, regular rhythm, normal heart sounds and intact distal pulses.   Pulmonary/Chest: Effort normal and breath sounds normal.  Abdominal: Soft. Bowel sounds are normal.  Lymphadenopathy:    She has no cervical adenopathy.  Neurological: She is alert and oriented to person, place, and time.  Skin: Skin is warm and dry. Capillary refill takes less than 2 seconds.  Psychiatric: She has a normal mood and affect. Her behavior is normal.  Nursing note and vitals reviewed.    Musculoskeletal Exam: C-spine, thoracic, lumbar spine good range of motion. Shoulder joints elbow joints, wrist joints, MCPs PIPs DIPs with good range of motion. She has tenderness across her MCP joints. She has good range of motion of her hip joints knee joints ankles MTPs PIPs DIPs she is tenderness across her MTP joints in her right ankle joint with no synovitis was noted.  CDAI Exam: CDAI Homunculus Exam:   Joint Counts:  CDAI Tender Joint count: 0 CDAI Swollen Joint count: 0  Global Assessments:  Patient Global Assessment: 7 Provider Global Assessment: 4  CDAI Calculated Score: 11    Investigation: Findings:  08/05/2016 CK 89, UA negative, uric acid 5.2, CCP negative, 14 33 eta 0.9 positive, G6PD normal, ultrasound positive for synovitis in MCPs and wrist joints  Office Visit on 08/05/2016  Component Date Value Ref Range Status  .  Total CK 08/05/2016 89  7 - 177 U/L Final  . Cyclic Citrullin Peptide Ab 08/06/2016 <16  Units Final   Comment:   Reference Range Negative               < 20 Weak Positive            20 - 39 Moderate Positive        40 - 59 Strong Positive        > 59   . G-6PDH 08/07/2016 10.4  4.6 - 13.5 U/g Hgb Final  . Uric Acid, Serum 08/05/2016 5.2  2.5 - 7.0 mg/dL Final  . 14-3-3 eta Protein 08/09/2016 0.9* <0.2 ng/mL Final   Comment:   This test was developed and its analytical performance characteristics have been determined by [facility]. It has not been cleared or approved by FDA. This assay has been validated pursuant to the CLIA regulations and is used for clinical purposes.   Admission on 08/02/2016, Discharged on 08/02/2016  Component Date Value Ref Range Status  . Glucose, UA 08/02/2016 NEGATIVE  NEGATIVE mg/dL Final  . Bilirubin Urine 08/02/2016 NEGATIVE  NEGATIVE Final  . Ketones, ur 08/02/2016 NEGATIVE  NEGATIVE mg/dL Final  . Specific Gravity, Urine 08/02/2016 >=1.030  1.005 - 1.030 Final  . Hgb urine dipstick 08/02/2016 TRACE* NEGATIVE Final  . pH 08/02/2016 5.5  5.0 - 8.0 Final  . Protein, ur 08/02/2016 NEGATIVE  NEGATIVE mg/dL Final  . Urobilinogen, UA 08/02/2016 0.2  0.0 - 1.0 mg/dL Final  . Nitrite 08/02/2016 NEGATIVE  NEGATIVE Final  . Leukocytes, UA 08/02/2016 NEGATIVE  NEGATIVE Final    Imaging: [place] Extrem Low Bilat Ltd  Result Date: 08/21/2016 Chief complaint: pain in bilateral feet Per EULAR recommendation, using 12 MHz transducer, grayscale and power Doppler ultrasound examination of bilateral feet was performed. We evaluated bilateral first second and fourth fifth MTPs both dorsal and plantar aspect bilateral tibiotalar joints. There was no synovitis or tenosynovitis noted. She had narrowing of bilateral first MTP joints. Impression: The ultrasound examination did not show any synovitis or  tenosynovitis. These findings  were consistent with osteoarthritis.  [place] Extrem Up Bilat Comp  Result Date: 08/21/2016 Ultrasound examination of bilateral hands was performed per EULAR recommendations. Using 12 MHz transducer, grayscale and power Doppler bilateral second, third, and fifth MCP joints and bilateral wrist joints both dorsal and volar aspects were evaluated to look for synovitis or tenosynovitis. The findings were there was  synovitis in bilateral third MCP joints and right wrist joint  on ultrasound examination. Her right median nerve was 0. 09 cm squares which was upper limits of normal and left median nerve was 0.08 cm squares which was within normal limits. Impression: Ultrasound examination of bilateral hands was consistent with inflammatory arthritis   Speciality Comments: No specialty comments available.    Procedures:  No procedures performed Allergies: Patient has no known allergies.   Assessment / Plan:     Visit Diagnoses: Rheumatoid arthritis  - Rheumatid factor negative, CCP negative, 14 33 eta positive, positive synovitis on ultrasound. She continues to have arthralgias and discomfort in her hands especially with typing. She also has ongoing discomfort in her knee joints and feet. I do not see any synovitis but she has tenderness on palpation over her MCPs and. MTPs. Different treatment options and their side effects were discussed at length. After reviewing indications side effects contraindications a handout on Plaquenil was given consent was taken. The plan is to start her on Plaquenil 200 mg 1 tablet a.m. and half tablet p.m. daily. I'll check her lab work in a month then 3 months then every 5 months to monitor for drug toxicity. She's been also advised to get baseline eye exam and then eye exam he is on a yearly basis to monitor for ocular toxicity. She will also need pneumococcal vaccine for long-term immunosuppressive therapy.  Pain in both hands: On going arthralgias and tenderness but no  synovitis on examination she had positive ultrasound.  Pain in joints of both feet: She has ongoing discomfort with no synovitis  Other fatigue  Vitamin D deficiency: She is on supplements  Gastroesophageal reflux disease without esophagitis    Orders: Orders Placed This Encounter  Procedures  . CBC with Differential/Platelet  . COMPLETE METABOLIC PANEL WITH GFR   Meds ordered this encounter  Medications  . hydroxychloroquine (PLAQUENIL) 200 MG tablet    Sig: 1tablet po qAM and 1/2 tablet po qPM    Dispense:  45 tablet    Refill:  3    Face-to-face time spent with patient was 30 minutes. 50% of time was spent in  counseling and coordination of care.  Follow-Up Instructions: Return in about 3 months (around 12/05/2016) for Rheumatoid arthritis.   Bo Merino, MD

## 2016-09-05 DIAGNOSIS — M19072 Primary osteoarthritis, left ankle and foot: Secondary | ICD-10-CM

## 2016-09-05 DIAGNOSIS — M19041 Primary osteoarthritis, right hand: Secondary | ICD-10-CM | POA: Insufficient documentation

## 2016-09-05 DIAGNOSIS — M19071 Primary osteoarthritis, right ankle and foot: Secondary | ICD-10-CM | POA: Insufficient documentation

## 2016-09-05 DIAGNOSIS — M19042 Primary osteoarthritis, left hand: Secondary | ICD-10-CM

## 2016-09-05 DIAGNOSIS — M0579 Rheumatoid arthritis with rheumatoid factor of multiple sites without organ or systems involvement: Secondary | ICD-10-CM | POA: Insufficient documentation

## 2016-09-05 DIAGNOSIS — M170 Bilateral primary osteoarthritis of knee: Secondary | ICD-10-CM | POA: Insufficient documentation

## 2016-09-06 ENCOUNTER — Ambulatory Visit (INDEPENDENT_AMBULATORY_CARE_PROVIDER_SITE_OTHER): Payer: 59 | Admitting: Rheumatology

## 2016-09-06 ENCOUNTER — Encounter: Payer: Self-pay | Admitting: Rheumatology

## 2016-09-06 VITALS — BP 130/81 | HR 75 | Resp 14 | Ht 61.25 in | Wt 179.0 lb

## 2016-09-06 DIAGNOSIS — M19041 Primary osteoarthritis, right hand: Secondary | ICD-10-CM | POA: Diagnosis not present

## 2016-09-06 DIAGNOSIS — E559 Vitamin D deficiency, unspecified: Secondary | ICD-10-CM

## 2016-09-06 DIAGNOSIS — M19042 Primary osteoarthritis, left hand: Secondary | ICD-10-CM | POA: Diagnosis not present

## 2016-09-06 DIAGNOSIS — M170 Bilateral primary osteoarthritis of knee: Secondary | ICD-10-CM

## 2016-09-06 DIAGNOSIS — K219 Gastro-esophageal reflux disease without esophagitis: Secondary | ICD-10-CM

## 2016-09-06 DIAGNOSIS — Z79899 Other long term (current) drug therapy: Secondary | ICD-10-CM

## 2016-09-06 DIAGNOSIS — M19071 Primary osteoarthritis, right ankle and foot: Secondary | ICD-10-CM

## 2016-09-06 DIAGNOSIS — R5383 Other fatigue: Secondary | ICD-10-CM | POA: Diagnosis not present

## 2016-09-06 DIAGNOSIS — M0579 Rheumatoid arthritis with rheumatoid factor of multiple sites without organ or systems involvement: Secondary | ICD-10-CM

## 2016-09-06 DIAGNOSIS — M19072 Primary osteoarthritis, left ankle and foot: Secondary | ICD-10-CM

## 2016-09-06 MED ORDER — HYDROXYCHLOROQUINE SULFATE 200 MG PO TABS: ORAL_TABLET | ORAL | 3 refills | Status: DC

## 2016-09-06 NOTE — Progress Notes (Signed)
Pharmacy Note  Subjective: Patient presents today to the [facility] to see Dr. Estanislado Pandy.  Patient seen by the pharmacist for counseling on hydroxychloroquine.    Objective: 05/01/16 SCr: 0.71 mg/dL BUN: 12 mg/dL GFR: 115 mL/min/1.73 AST: 20 U/L ALT: 13 U/L  05/01/16 WBC: 11.2 K/uL RBC: 3.9 MIL/uL Hgb: 12.9 g/dL Hct: 38.2 % PLT: 268 K/uL  Assessment/Plan: Patient was prescribed hydroxychloroquine 200 mg in the morning and 100 mg in the evening.  Patient was counseled on the purpose, proper use, and adverse effects of hydroxychloroquine including nausea/diarrhea, skin rash, headaches, and sun sensitivity.  Discussed importance of annual eye exams while on hydroxychloroquine to monitor to ocular toxicity and discussed importance of frequent laboratory monitoring.  Provided patient with eye exam form for baseline ophthalmologic exam and standing lab instructions.  Provided patient with educational materials on hydroxychloroquine and answered all questions.  Patient consented to hydroxychloroquine.  Will upload consent in the media tab.    Elisabeth Most, Pharm.D., BCPS Clinical Pharmacist Pager: [phone] Phone: [phone] 09/06/2016 9:55 AM

## 2016-09-06 NOTE — Patient Instructions (Addendum)
Standing Labs We placed an order today for your standing lab work.    Please come back and get your standing labs in January and every 3 months  We have open lab Monday through Friday from 8:30-11:30 AM and 1-4 PM at the office of Dr. Tresa Moore, PA.   The office is located at [address], [place], [place], [place] [address] No appointment is necessary.   Labs are drawn by [facility].  You may receive a bill from [facility] for your lab work.     Hydroxychloroquine tablets What is this medicine? HYDROXYCHLOROQUINE (hye drox ee KLOR oh kwin) is used to treat rheumatoid arthritis and systemic lupus erythematosus. It is also used to treat malaria. This medicine may be used for other purposes; ask your health care provider or pharmacist if you have questions. COMMON BRAND NAME(S): Plaquenil, Quineprox What should I tell my health care provider before I take this medicine? They need to know if you have any of these conditions: -diabetes -eye disease, vision problems -G6PD deficiency -history of blood diseases -history of irregular heartbeat -if you often drink alcohol -kidney disease -liver disease -porphyria -psoriasis -seizures -an unusual or allergic reaction to chloroquine, hydroxychloroquine, other medicines, foods, dyes, or preservatives -pregnant or trying to get pregnant -breast-feeding How should I use this medicine? Take this medicine by mouth with a glass of water. Follow the directions on the prescription label. Avoid taking antacids within 4 hours of taking this medicine. It is best to separate these medicines by at least 4 hours. Do not cut, crush or chew this medicine. You can take it with or without food. If it upsets your stomach, take it with food. Take your medicine at regular intervals. Do not take your medicine more often than directed. Take all of your medicine as directed even if you think you are better. Do not skip doses or stop your medicine  early. Talk to your pediatrician regarding the use of this medicine in children. While this drug may be prescribed for selected conditions, precautions do apply. Overdosage: If you think you have taken too much of this medicine contact a poison control center or emergency room at once. NOTE: This medicine is only for you. Do not share this medicine with others. What if I miss a dose? If you miss a dose, take it as soon as you can. If it is almost time for your next dose, take only that dose. Do not take double or extra doses. What may interact with this medicine? Do not take this medicine with any of the following medications: -cisapride -dofetilide -dronedarone -live virus vaccines -penicillamine -pimozide -thioridazine -ziprasidone This medicine may also interact with the following medications: -ampicillin -antacids -cimetidine -cyclosporine -digoxin -medicines for diabetes, like insulin, glipizide, glyburide -medicines for seizures like carbamazepine, phenobarbital, phenytoin -mefloquine -methotrexate -other medicines that prolong the QT interval (cause an abnormal heart rhythm) -praziquantel This list may not describe all possible interactions. Give your health care provider a list of all the medicines, herbs, non-prescription drugs, or dietary supplements you use. Also tell them if you smoke, drink alcohol, or use illegal drugs. Some items may interact with your medicine. What should I watch for while using this medicine? Tell your doctor or healthcare professional if your symptoms do not start to get better or if they get worse. Avoid taking antacids within 4 hours of taking this medicine. It is best to separate these medicines by at least 4 hours. Tell your doctor or health care  professional right away if you have any change in your eyesight. Your vision and blood may be tested before and during use of this medicine. This medicine can make you more sensitive to the sun. Keep  out of the sun. If you cannot avoid being in the sun, wear protective clothing and use sunscreen. Do not use sun lamps or tanning beds/booths. What side effects may I notice from receiving this medicine? Side effects that you should report to your doctor or health care professional as soon as possible: -allergic reactions like skin rash, itching or hives, swelling of the face, lips, or tongue -changes in vision -decreased hearing or ringing of the ears -redness, blistering, peeling or loosening of the skin, including inside the mouth -seizures -sensitivity to light -signs and symptoms of a dangerous change in heartbeat or heart rhythm like chest pain; dizziness; fast or irregular heartbeat; palpitations; feeling faint or lightheaded, falls; breathing problems -signs and symptoms of liver injury like dark yellow or brown urine; general ill feeling or flu-like symptoms; light-colored stools; loss of appetite; nausea; right upper belly pain; unusually weak or tired; yellowing of the eyes or skin -signs and symptoms of low blood sugar such as feeling anxious; confusion; dizziness; increased hunger; unusually weak or tired; sweating; shakiness; cold; irritable; headache; blurred vision; fast heartbeat; loss of consciousness -uncontrollable head, mouth, neck, arm, or leg movements Side effects that usually do not require medical attention (report to your doctor or health care professional if they continue or are bothersome): -anxious -diarrhea -dizziness -hair loss -headache -irritable -loss of appetite -nausea, vomiting -stomach pain This list may not describe all possible side effects. Call your doctor for medical advice about side effects. You may report side effects to FDA at 1-800-FDA-1088. Where should I keep my medicine? Keep out of the reach of children. In children, this medicine can cause overdose with small doses. Store at room temperature between 15 and 30 degrees C (59 and 86 degrees  F). Protect from moisture and light. Throw away any unused medicine after the expiration date. NOTE: This sheet is a summary. It may not cover all possible information. If you have questions about this medicine, talk to your doctor, pharmacist, or health care provider.  2017 Elsevier/Gold Standard (2016-05-01 14:16:15)

## 2016-12-25 ENCOUNTER — Emergency Department (HOSPITAL_COMMUNITY)
Admission: EM | Admit: 2016-12-25 | Discharge: 2016-12-26 | Disposition: A | Discharge status: Home / Self Care | Payer: 59 | Attending: Emergency Medicine | Admitting: Emergency Medicine

## 2016-12-25 ENCOUNTER — Encounter (HOSPITAL_COMMUNITY): Payer: Self-pay

## 2016-12-25 DIAGNOSIS — J45909 Unspecified asthma, uncomplicated: Secondary | ICD-10-CM | POA: Insufficient documentation

## 2016-12-25 DIAGNOSIS — R112 Nausea with vomiting, unspecified: Secondary | ICD-10-CM | POA: Insufficient documentation

## 2016-12-25 HISTORY — DX: Unspecified osteoarthritis, unspecified site: M19.90

## 2016-12-25 MED ORDER — ONDANSETRON 4 MG PO TBDP
4.0000 mg | ORAL_TABLET | Freq: Once | ORAL | Status: AC
  Administered 2016-12-25: 4 mg via ORAL
  Filled 2016-12-25: qty 1

## 2016-12-25 NOTE — ED Triage Notes (Signed)
Pt states she started feeling nauseated after drinking a small amt of a wine cooler approx 10 pm.  Pt states she vomited x 3 since then, denies pain or other complaints

## 2016-12-25 NOTE — ED Provider Notes (Signed)
[facility] DEPT Provider Note   CSN: [id number] Arrival date & time: 12/25/16  2255  By signing my name below, I, Margit Banda, attest that this documentation has been prepared under the direction and in the presence of Ezequiel Essex, MD. Electronically Signed: Margit Banda, ED Scribe. 12/25/16. 11:48 PM.   History   Chief Complaint Chief Complaint  Patient presents with  . Emesis    HPI Lauren Glover is a 51 y.o. female who presents to the Emergency Department complaining of nausea that started earlier in the evening. Pt reports she took a potassium supplement (not prescribed) for the first time and ~ 3 hours later she became nauseous and vomited three times. In between this time, pt drank half of a wine cooler. Associated sx include feeling clammy, dizziness, blood in her stool, soft stool (light brown). Sx improved after vomiting. No h/o of heart or lung problems. LMP ~ 3 weeks ago. No BCP. Pt denies HA, fever.   The history is provided by the patient. No language interpreter was used.    Past Medical History:  Diagnosis Date  . Arthritis   . Chronic constipation   . Diverticulitis of colon   . GERD (gastroesophageal reflux disease)   . Hiatal hernia   . History of kidney stones   . Menorrhagia   . Uterine fibroid     Patient Active Problem List   Diagnosis Date Noted  . Rheumatoid arthritis with rheumatoid factor 09/05/2016  . Primary osteoarthritis of both knees 09/05/2016  . Primary osteoarthritis of both hands 09/05/2016  . Primary osteoarthritis of both feet 09/05/2016  . Hand pain 09/04/2016  . Gastroesophageal reflux 08/05/2016  . History of kidney stones 08/05/2016  . Asthma 08/05/2016  . Arthralgia 08/02/2016  . Vitamin D deficiency 08/02/2016  . Fatigue 08/02/2016  . Family history of polyps in the colon   . Diverticulosis of colon without hemorrhage   . Encounter for screening colonoscopy 08/17/2015  . Constipation 08/17/2015    Past  Surgical History:  Procedure Laterality Date  . COLONOSCOPY N/A 09/11/2015   Procedure: COLONOSCOPY;  Surgeon: Daneil Dolin, MD;  [facility];  Service: Endoscopy;  Laterality: N/A;  . ESOPHAGOGASTRODUODENOSCOPY   11/10/2007   Dr. Rourk:Normal esophagus, hiatal hernia.  Small polypoid lesion in the cardia of uncertain significance, status post biopsy removal otherwise normal stomach. Benign fundic gland polyp   . EXTRACORPOREAL SHOCK WAVE LITHOTRIPSY Left 04-30-2010  . LAPAROSCOPIC APPENDECTOMY  07-04-2009  . LAPAROSCOPIC CHOLECYSTECTOMY    . REMOVAL BENIGN RUMOR LEFT CALF      OB History    No data available       Home Medications    Prior to Admission medications   Medication Sig Start Date End Date Taking? Authorizing Provider  albuterol (PROVENTIL HFA;VENTOLIN HFA) 108 (90 BASE) MCG/ACT inhaler Inhale 2 puffs into the lungs every 6 (six) hours as needed for wheezing.   Yes Historical Provider, MD    Family History Family History  Problem Relation Age of Onset  . Colon polyps Sister   . High blood pressure Sister   . High blood pressure Mother   . High blood pressure Brother   . Lupus Sister   . Colon cancer Neg Hx     Social History Social History  Substance Use Topics  . Smoking status: Never Smoker  . Smokeless tobacco: Never Used  . Alcohol use No     Allergies   Patient has no known allergies.  Review of Systems Review of Systems  Gastrointestinal: Positive for vomiting.   A complete 10 system review of systems was obtained and all systems are negative except as noted in the HPI and PMH.    Physical Exam Updated Vital Signs BP 125/78 (BP Location: Left Arm)   Pulse 66   Temp 97.7 F (36.5 C) (Oral)   Resp 16   Ht 5' 1.25" (1.556 m)   Wt 170 lb (77.1 kg)   LMP 12/09/2016   SpO2 100%   BMI 31.86 kg/m   Physical Exam  Constitutional: She is oriented to person, place, and time. She appears well-developed and well-nourished. No  distress.  HENT:  Head: Normocephalic and atraumatic.  Mouth/Throat: Oropharynx is clear and moist. No oropharyngeal exudate.  Eyes: Conjunctivae and EOM are normal. Pupils are equal, round, and reactive to light.  Neck: Normal range of motion. Neck supple.  No meningismus.  Cardiovascular: Normal rate, regular rhythm, normal heart sounds and intact distal pulses.   No murmur heard. Pulmonary/Chest: Effort normal and breath sounds normal. No respiratory distress.  Abdominal: Soft. There is no tenderness. There is no rebound and no guarding.  Genitourinary:  Genitourinary Comments: Chaperone present. No external hemorrhoids or fissures. No gross blood.   Musculoskeletal: Normal range of motion. She exhibits no edema or tenderness.  Neurological: She is alert and oriented to person, place, and time. No cranial nerve deficit. She exhibits normal muscle tone. Coordination normal.   5/5 strength throughout. CN 2-12 intact.Equal grip strength.   Skin: Skin is warm.  Psychiatric: She has a normal mood and affect. Her behavior is normal.  Nursing note and vitals reviewed.    ED Treatments / Results  DIAGNOSTIC STUDIES: Oxygen Saturation is 100% on RA, normal by my interpretation.   COORDINATION OF CARE: 11:48 PM-Discussed next steps with pt. Pt verbalized understanding and is agreeable with the plan.    Labs (all labs ordered are listed, but only abnormal results are displayed) Labs Reviewed  CBC WITH DIFFERENTIAL/PLATELET - Abnormal; Notable for the following:       Result Value   RBC 3.73 (*)    All other components within normal limits  COMPREHENSIVE METABOLIC PANEL - Abnormal; Notable for the following:    Glucose, Bld 112 (*)    All other components within normal limits  URINALYSIS, ROUTINE W REFLEX MICROSCOPIC - Abnormal; Notable for the following:    Hgb urine dipstick MODERATE (*)    Squamous Epithelial / LPF 0-5 (*)    All other components within normal limits  LIPASE,  BLOOD  TROPONIN I  PREGNANCY, URINE  POC OCCULT BLOOD, ED    EKG  EKG Interpretation  Date/Time:  Thursday December 26 2016 00:02:19 EDT Ventricular Rate:  70 PR Interval:    QRS Duration: 82 QT Interval:  414 QTC Calculation: 447 R Axis:   63 Text Interpretation:  Sinus rhythm No significant change was found Confirmed by Wyvonnia Dusky  MD, Anthonny Schiller ([id number]) on 12/26/2016 12:23:10 AM       Radiology No results found.  Procedures Procedures (including critical care time)  Medications Ordered in ED Medications  ondansetron (ZOFRAN-ODT) disintegrating tablet 4 mg (not administered)     Initial Impression / Assessment and Plan / ED Course  I have reviewed the triage vital signs and the nursing notes.  Pertinent labs & imaging results that were available during my care of the patient were reviewed by me and considered in my medical decision making (see chart for  details).     Nausea and vomiting with clamminess after taking KCl pill and alcohol.  No CP or SOB.  No abdominal pain.  Saw some blood on TP after wiping.  EKG nsr. nonfocal neuro exam  Labs reassuring. Patient feels improved. FOBT negative.  No vomiting the ED. She is tolerating by mouth. She is requesting discharge. Workup is reassuring. Suspect her symptoms to be taking potassium pills as well as alcohol. Discussed with patient that she does not need to take potassium pills as her potassium level is normal.  Follow-up with her primary care physician. Return precautions discussed. Final Clinical Impressions(s) / ED Diagnoses   Final diagnoses:  Non-intractable vomiting with nausea, unspecified vomiting type    New Prescriptions New Prescriptions   No medications on file   I personally performed the services described in this documentation, which was scribed in my presence. The recorded information has been reviewed and is accurate.    Ezequiel Essex, MD 12/26/16 [phone]

## 2016-12-26 LAB — CBC WITH DIFFERENTIAL/PLATELET
BASOS ABS: 0 10*3/uL (ref 0.0–0.1)
BASOS PCT: 1 %
Eosinophils Absolute: 0.1 10*3/uL (ref 0.0–0.7)
Eosinophils Relative: 2 %
HEMATOCRIT: 36.4 % (ref 36.0–46.0)
HEMOGLOBIN: 12.4 g/dL (ref 12.0–15.0)
Lymphocytes Relative: 25 %
Lymphs Abs: 1.5 10*3/uL (ref 0.7–4.0)
MCH: 33.2 pg (ref 26.0–34.0)
MCHC: 34.1 g/dL (ref 30.0–36.0)
MCV: 97.6 fL (ref 78.0–100.0)
MONOS PCT: 9 %
Monocytes Absolute: 0.5 10*3/uL (ref 0.1–1.0)
NEUTROS ABS: 4 10*3/uL (ref 1.7–7.7)
Neutrophils Relative %: 64 %
Platelets: 207 10*3/uL (ref 150–400)
RBC: 3.73 MIL/uL — ABNORMAL LOW (ref 3.87–5.11)
RDW: 14.4 % (ref 11.5–15.5)
WBC: 6.2 10*3/uL (ref 4.0–10.5)

## 2016-12-26 LAB — COMPREHENSIVE METABOLIC PANEL
ALBUMIN: 3.9 g/dL (ref 3.5–5.0)
ALK PHOS: 70 U/L (ref 38–126)
ALT: 17 U/L (ref 14–54)
AST: 22 U/L (ref 15–41)
Anion gap: 7 (ref 5–15)
BILIRUBIN TOTAL: 0.6 mg/dL (ref 0.3–1.2)
BUN: 20 mg/dL (ref 6–20)
CALCIUM: 9.1 mg/dL (ref 8.9–10.3)
CO2: 26 mmol/L (ref 22–32)
Chloride: 103 mmol/L (ref 101–111)
Creatinine, Ser: 0.71 mg/dL (ref 0.44–1.00)
GFR calc Af Amer: >60 mL/min (ref >60)
GFR calc non Af Amer: >60 mL/min (ref >60)
GLUCOSE: 112 mg/dL — AB (ref 65–99)
Potassium: 3.7 mmol/L (ref 3.5–5.1)
Sodium: 136 mmol/L (ref 135–145)
TOTAL PROTEIN: 6.8 g/dL (ref 6.5–8.1)

## 2016-12-26 LAB — URINALYSIS, ROUTINE W REFLEX MICROSCOPIC
BILIRUBIN URINE: NEGATIVE
Bacteria, UA: NONE SEEN
GLUCOSE, UA: NEGATIVE mg/dL
Ketones, ur: NEGATIVE mg/dL
Leukocytes, UA: NEGATIVE
Nitrite: NEGATIVE
PH: 5 (ref 5.0–8.0)
Protein, ur: NEGATIVE mg/dL
SPECIFIC GRAVITY, URINE: 1.023 (ref 1.005–1.030)

## 2016-12-26 LAB — LIPASE, BLOOD: Lipase: 30 U/L (ref 11–51)

## 2016-12-26 LAB — TROPONIN I: Troponin I: <0.03 ng/mL (ref <0.03)

## 2016-12-26 LAB — POC OCCULT BLOOD, ED: Fecal Occult Bld: NEGATIVE

## 2016-12-26 LAB — PREGNANCY, URINE: Preg Test, Ur: NEGATIVE

## 2016-12-26 MED ORDER — ONDANSETRON HCL 4 MG PO TABS: 4.0000 mg | ORAL_TABLET | Freq: Three times a day (TID) | ORAL | 0 refills | Status: DC | PRN

## 2016-12-26 NOTE — ED Notes (Signed)
Pt states she feels better and is ready to go home and go to bed.

## 2016-12-26 NOTE — Discharge Instructions (Signed)
You do not need to take potassium. Follow up with your doctor. Use the nausea medication as prescribed. Return to the ED if you develop new or worsening symptoms.

## 2016-12-26 NOTE — ED Notes (Signed)
Pt was given ginger ale.

## 2017-01-22 ENCOUNTER — Ambulatory Visit: Payer: 59 | Admitting: Nurse Practitioner

## 2017-05-13 ENCOUNTER — Telehealth: Payer: Self-pay | Admitting: Radiology

## 2017-05-13 NOTE — Telephone Encounter (Signed)
Patients insurance company has questioned why we do not have patient on meds (DMARD)  for her Rheumatoid arthritis. She was prescribed plaquenil but did not fill this. I have asked the front desk to reach out to her to make a follow up appointment if she would like one. Lauren Glover [facility] has sent the note from the insurance company to be scanned.

## 2017-06-19 ENCOUNTER — Telehealth: Payer: Self-pay | Admitting: Rheumatology

## 2017-06-19 NOTE — Telephone Encounter (Signed)
-----   Message from Candice Camp, RT sent at 05/13/2017  2:28 PM EDT ----- Patients insurance company has questioned why we do not have patient on meds for her Rheumatoid arthritis. Will you please reach out to her to make a follow up appointment if she would like one? Do not need to work her in.

## 2017-06-19 NOTE — Telephone Encounter (Signed)
I spoke with patient, and scheduled a rov. Patient has stopped medication because it was making her feeling so bad.

## 2017-07-22 NOTE — Progress Notes (Deleted)
Office Visit Note  Patient: Lauren Glover             Date of Birth: 1965/10/04           MRN: [id number]             PCP: Sharilyn Sites, MD Referring: Sharilyn Sites, MD Visit Date: 08/04/2017 Occupation: @GUAROCC @    Subjective:  No chief complaint on file.   History of Present Illness: Lauren Glover is a 51 y.o. female ***   Activities of Daily Living:  Patient reports morning stiffness for *** {minute/hour:19697}.   Patient {ACTIONS;DENIES/REPORTS:21021675::"Denies"} nocturnal pain.  Difficulty dressing/grooming: {ACTIONS;DENIES/REPORTS:21021675::"Denies"} Difficulty climbing stairs: {ACTIONS;DENIES/REPORTS:21021675::"Denies"} Difficulty getting out of chair: {ACTIONS;DENIES/REPORTS:21021675::"Denies"} Difficulty using hands for taps, buttons, cutlery, and/or writing: {ACTIONS;DENIES/REPORTS:21021675::"Denies"}   No Rheumatology ROS completed.   PMFS History:  Patient Active Problem List   Diagnosis Date Noted  . Rheumatoid arthritis with rheumatoid factor 09/05/2016  . Primary osteoarthritis of both knees 09/05/2016  . Primary osteoarthritis of both hands 09/05/2016  . Primary osteoarthritis of both feet 09/05/2016  . Hand pain 09/04/2016  . Gastroesophageal reflux 08/05/2016  . History of kidney stones 08/05/2016  . Asthma 08/05/2016  . Arthralgia 08/02/2016  . Vitamin D deficiency 08/02/2016  . Fatigue 08/02/2016  . Family history of polyps in the colon   . Diverticulosis of colon without hemorrhage   . Encounter for screening colonoscopy 08/17/2015  . Constipation 08/17/2015    Past Medical History:  Diagnosis Date  . Arthritis   . Chronic constipation   . Diverticulitis of colon   . GERD (gastroesophageal reflux disease)   . Hiatal hernia   . History of kidney stones   . Menorrhagia   . Uterine fibroid     Family History  Problem Relation Age of Onset  . Colon polyps Sister   . High blood pressure Sister   . High blood pressure Mother   . High  blood pressure Brother   . Lupus Sister   . Colon cancer Neg Hx    Past Surgical History:  Procedure Laterality Date  . COLONOSCOPY N/A 09/11/2015   Procedure: COLONOSCOPY;  Surgeon: Daneil Dolin, MD;  Location: [facility];  Service: Endoscopy;  Laterality: N/A;  . ESOPHAGOGASTRODUODENOSCOPY   11/10/2007   Dr. Rourk:Normal esophagus, hiatal hernia.  Small polypoid lesion in the cardia of uncertain significance, status post biopsy removal otherwise normal stomach. Benign fundic gland polyp   . EXTRACORPOREAL SHOCK WAVE LITHOTRIPSY Left 04-30-2010  . LAPAROSCOPIC APPENDECTOMY  07-04-2009  . LAPAROSCOPIC CHOLECYSTECTOMY    . REMOVAL BENIGN RUMOR LEFT CALF     Social History   Social History Narrative  . No narrative on file     Objective: Vital Signs: There were no vitals taken for this visit.   Physical Exam   Musculoskeletal Exam: ***  CDAI Exam: No CDAI exam completed.    Investigation: No additional findings. CBC Latest Ref Rng & Units 12/26/2016 01/25/2015 05/01/2010  WBC 4.0 - 10.5 K/uL 6.2 4.1 8.8  Hemoglobin 12.0 - 15.0 g/dL 12.4 13.0 12.9  Hematocrit 36.0 - 46.0 % 36.4 39.4 37.4  Platelets 150 - 400 K/uL 207 245 195   CMP Latest Ref Rng & Units 12/26/2016 01/25/2015 05/01/2010  Glucose 65 - 99 mg/dL 112(H) 101(H) 121(H)  BUN 6 - 20 mg/dL 20 15 13   Creatinine 0.44 - 1.00 mg/dL 0.71 0.78 0.81  Sodium 135 - 145 mmol/L 136 140 136  Potassium 3.5 - 5.1 mmol/L  3.7 3.9 3.7  Chloride 101 - 111 mmol/L 103 107 109  CO2 22 - 32 mmol/L 26 26 18(L)  Calcium 8.9 - 10.3 mg/dL 9.1 9.3 8.8  Total Protein 6.5 - 8.1 g/dL 6.8 7.1 -  Total Bilirubin 0.3 - 1.2 mg/dL 0.6 0.7 -  Alkaline Phos 38 - 126 U/L 70 82 -  AST 15 - 41 U/L 22 22 -  ALT 14 - 54 U/L 17 16 -    Imaging: No results found.  Speciality Comments: No specialty comments available.    Procedures:  No procedures performed Allergies: Patient has no known allergies.   Assessment / Plan:     Visit Diagnoses:  No diagnosis found.    Orders: No orders of the defined types were placed in this encounter.  No orders of the defined types were placed in this encounter.   Face-to-face time spent with patient was *** minutes. 50% of time was spent in counseling and coordination of care.  Follow-Up Instructions: No Follow-up on file.   Earnestine Mealing, NT  Note - This record has been created using Editor, commissioning.  Chart creation errors have been sought, but may not always  have been located. Such creation errors do not reflect on  the standard of medical care.

## 2017-08-04 ENCOUNTER — Ambulatory Visit: Payer: 59 | Admitting: Rheumatology

## 2017-12-21 ENCOUNTER — Encounter (HOSPITAL_COMMUNITY): Payer: Self-pay | Admitting: Student

## 2017-12-21 ENCOUNTER — Other Ambulatory Visit: Payer: Self-pay

## 2017-12-21 ENCOUNTER — Emergency Department (HOSPITAL_COMMUNITY)
Admission: EM | Admit: 2017-12-21 | Discharge: 2017-12-21 | Disposition: A | Discharge status: Home / Self Care | Payer: 59 | Attending: Emergency Medicine | Admitting: Emergency Medicine

## 2017-12-21 DIAGNOSIS — J029 Acute pharyngitis, unspecified: Secondary | ICD-10-CM

## 2017-12-21 DIAGNOSIS — B349 Viral infection, unspecified: Secondary | ICD-10-CM | POA: Insufficient documentation

## 2017-12-21 DIAGNOSIS — J45909 Unspecified asthma, uncomplicated: Secondary | ICD-10-CM | POA: Insufficient documentation

## 2017-12-21 LAB — RAPID STREP SCREEN (MED CTR MEBANE ONLY): STREPTOCOCCUS, GROUP A SCREEN (DIRECT): NEGATIVE

## 2017-12-21 MED ORDER — DEXAMETHASONE 4 MG PO TABS
10.0000 mg | ORAL_TABLET | Freq: Once | ORAL | Status: AC
Start: 2017-12-21 — End: 2017-12-21
  Administered 2017-12-21: 10 mg via ORAL
  Filled 2017-12-21: qty 3

## 2017-12-21 MED ORDER — TRAMADOL HCL 50 MG PO TABS: 50.0000 mg | ORAL_TABLET | Freq: Four times a day (QID) | ORAL | 0 refills | Status: DC | PRN

## 2017-12-21 NOTE — ED Provider Notes (Signed)
[facility] EMERGENCY DEPARTMENT Provider Note   CSN: [id number] Arrival date & time: 12/21/17  0216     History   Chief Complaint Chief Complaint  Patient presents with  . Sore Throat    HPI Lauren Glover is a 52 y.o. female.  The history is provided by the patient.  Sore Throat   She has a history of rheumatoid arthritis, asthma, gastroesophageal reflux and comes in complaining of a sore throat for the last week.  Pain got significantly worse this morning, and she rates pain at 7/10.  Pain is worse with swallowing.  She denies fever, chills, sweats.  She denies rhinorrhea.  She denies cough.  There has been no arthralgias or myalgias.  There is no nausea or vomiting.  She has not done anything at home to treat this.  Past Medical History:  Diagnosis Date  . Arthritis   . Chronic constipation   . Diverticulitis of colon   . GERD (gastroesophageal reflux disease)   . Hiatal hernia   . History of kidney stones   . Menorrhagia   . Uterine fibroid     Patient Active Problem List   Diagnosis Date Noted  . Rheumatoid arthritis with rheumatoid factor 09/05/2016  . Primary osteoarthritis of both knees 09/05/2016  . Primary osteoarthritis of both hands 09/05/2016  . Primary osteoarthritis of both feet 09/05/2016  . Hand pain 09/04/2016  . Gastroesophageal reflux 08/05/2016  . History of kidney stones 08/05/2016  . Asthma 08/05/2016  . Arthralgia 08/02/2016  . Vitamin D deficiency 08/02/2016  . Fatigue 08/02/2016  . Family history of polyps in the colon   . Diverticulosis of colon without hemorrhage   . Encounter for screening colonoscopy 08/17/2015  . Constipation 08/17/2015    Past Surgical History:  Procedure Laterality Date  . APPENDECTOMY    . COLONOSCOPY N/A 09/11/2015   Procedure: COLONOSCOPY;  Surgeon: Daneil Dolin, MD;  Location: [facility];  Service: Endoscopy;  Laterality: N/A;  . ESOPHAGOGASTRODUODENOSCOPY   11/10/2007   Dr. Rourk:Normal  esophagus, hiatal hernia.  Small polypoid lesion in the cardia of uncertain significance, status post biopsy removal otherwise normal stomach. Benign fundic gland polyp   . EXTRACORPOREAL SHOCK WAVE LITHOTRIPSY Left 04-30-2010  . LAPAROSCOPIC APPENDECTOMY  07-04-2009  . LAPAROSCOPIC CHOLECYSTECTOMY    . REMOVAL BENIGN RUMOR LEFT CALF       OB History   None      Home Medications    Prior to Admission medications   Medication Sig Start Date End Date Taking? Authorizing Provider  albuterol (PROVENTIL HFA;VENTOLIN HFA) 108 (90 BASE) MCG/ACT inhaler Inhale 2 puffs into the lungs every 6 (six) hours as needed for wheezing.    [provider]  ondansetron (ZOFRAN) 4 MG tablet Take 1 tablet (4 mg total) by mouth every 8 (eight) hours as needed for nausea or vomiting. 12/26/16   Ezequiel Essex, MD    Family History Family History  Problem Relation Age of Onset  . Colon polyps Sister   . High blood pressure Sister   . High blood pressure Mother   . High blood pressure Brother   . Lupus Sister   . Colon cancer Neg Hx     Social History Social History   Tobacco Use  . Smoking status: Never Smoker  . Smokeless tobacco: Never Used  Substance Use Topics  . Alcohol use: No    Alcohol/week: 0.0 oz  . Drug use: No     Allergies  Patient has no known allergies.   Review of Systems Review of Systems  All other systems reviewed and are negative.    Physical Exam Updated Vital Signs BP (!) 131/92 (BP Location: Left Arm)   Pulse 90   Temp 97.8 F (36.6 C) (Oral)   Resp 16   Ht 5\' 2"  (1.575 m)   Wt 78.9 kg (174 lb)   SpO2 100%   BMI 31.83 kg/m   Physical Exam  Nursing note and vitals reviewed.  52 year old female, resting comfortably and in no acute distress. Vital signs are significant for borderline elevated diastolic blood pressure. Oxygen saturation is 100%, which is normal. Head is normocephalic and atraumatic. PERRLA, EOMI. Oropharynx is clear.  There  is no pooling of secretions.  Phonation is normal. Neck is nontender and supple without adenopathy or JVD. Back is nontender and there is no CVA tenderness. Lungs are clear without rales, wheezes, or rhonchi. Chest is nontender. Heart has regular rate and rhythm without murmur. Abdomen is soft, flat, nontender without masses or hepatosplenomegaly and peristalsis is normoactive. Extremities have no cyanosis or edema, full range of motion is present. Skin is warm and dry without rash. Neurologic: Mental status is normal, cranial nerves are intact, there are no motor or sensory deficits.  ED Treatments / Results  Labs (all labs ordered are listed, but only abnormal results are displayed) Labs Reviewed  RAPID STREP SCREEN (NOT AT [facility])  CULTURE, GROUP A STREP [facility])   Procedures Procedures   Medications Ordered in ED Medications  dexamethasone (DECADRON) tablet 10 mg (10 mg Oral Given 12/21/17 0414)     Initial Impression / Assessment and Plan / ED Course  I have reviewed the triage vital signs and the nursing notes.  Pertinent lab results that were available during my care of the patient were reviewed by me and considered in my medical decision making (see chart for details).  Pharyngitis-viral versus bacterial.  Strep screen is pending.  Old records are reviewed, and she has no relevant past visits.  Strep screen is negative.  She is given a dose of dexamethasone and discharged with prescription for tramadol, but advised to use acetaminophen or ibuprofen preferentially for pain control.  Return precautions discussed.  Final Clinical Impressions(s) / ED Diagnoses   Final diagnoses:  Viral pharyngitis    ED Discharge Orders        Ordered    traMADol (ULTRAM) 50 MG tablet  Every 6 hours PRN     33/35/45 [id number]       Delora Fuel, MD 38/93/73 [phone]

## 2017-12-21 NOTE — Discharge Instructions (Signed)
Drink plenty of fluids. Take acetaminophen, ibuprofen, or naproxen as needed for less severe pain.

## 2017-12-21 NOTE — ED Triage Notes (Addendum)
Pt c/o sore throat since last weeks and sinus drainage. L ear pain

## 2017-12-23 LAB — CULTURE, GROUP A STREP (THRC)

## 2018-01-25 ENCOUNTER — Other Ambulatory Visit: Payer: Self-pay

## 2018-01-25 ENCOUNTER — Emergency Department (HOSPITAL_COMMUNITY)
Admission: EM | Admit: 2018-01-25 | Discharge: 2018-01-25 | Disposition: A | Discharge status: Home / Self Care | Payer: Non-veteran care | Attending: Emergency Medicine | Admitting: Emergency Medicine

## 2018-01-25 ENCOUNTER — Encounter (HOSPITAL_COMMUNITY): Payer: Self-pay | Admitting: *Deleted

## 2018-01-25 DIAGNOSIS — K0889 Other specified disorders of teeth and supporting structures: Secondary | ICD-10-CM | POA: Insufficient documentation

## 2018-01-25 DIAGNOSIS — H9202 Otalgia, left ear: Secondary | ICD-10-CM | POA: Diagnosis not present

## 2018-01-25 MED ORDER — IBUPROFEN 400 MG PO TABS
400.0000 mg | ORAL_TABLET | Freq: Once | ORAL | Status: AC
  Administered 2018-01-25: 400 mg via ORAL
  Filled 2018-01-25: qty 1

## 2018-01-25 MED ORDER — AMOXICILLIN 250 MG PO CAPS
500.0000 mg | ORAL_CAPSULE | Freq: Once | ORAL | Status: AC
  Administered 2018-01-25: 500 mg via ORAL
  Filled 2018-01-25: qty 2

## 2018-01-25 MED ORDER — AMOXICILLIN 500 MG PO CAPS: 500.0000 mg | ORAL_CAPSULE | Freq: Three times a day (TID) | ORAL | 0 refills | Status: DC

## 2018-01-25 NOTE — Discharge Instructions (Signed)
You can take ibuprofen every 6-8 hours for the next 5 days to help with your pain

## 2018-01-25 NOTE — ED Provider Notes (Signed)
[name] Note   CSN: [id number] Arrival date & time: 01/25/18  0422     History   Chief Complaint Chief Complaint  Patient presents with  . Otalgia    HPI Lauren Glover is a 52 y.o. female.  The history is provided by the patient.  Otalgia  This is a new problem. The current episode started 6 to 12 hours ago. There is pain in the left ear. The problem occurs constantly. The problem has been gradually worsening. There has been no fever. The pain is moderate. Pertinent negatives include no ear discharge, no headaches, no sore throat, no vomiting and no cough.  Patient reports gradual onset left ear pain.  She also reports left dental pain.  No fevers/vomiting.  No difficulty swallowing.  Reports mild muffled hearing No ear discharge.  No previous history of facial ear surgery No chest pain or shortness of breath is reported  Past Medical History:  Diagnosis Date  . Arthritis   . Chronic constipation   . Diverticulitis of colon   . GERD (gastroesophageal reflux disease)   . Hiatal hernia   . History of kidney stones   . Menorrhagia   . Uterine fibroid     Patient Active Problem List   Diagnosis Date Noted  . Rheumatoid arthritis with rheumatoid factor 09/05/2016  . Primary osteoarthritis of both knees 09/05/2016  . Primary osteoarthritis of both hands 09/05/2016  . Primary osteoarthritis of both feet 09/05/2016  . Hand pain 09/04/2016  . Gastroesophageal reflux 08/05/2016  . History of kidney stones 08/05/2016  . Asthma 08/05/2016  . Arthralgia 08/02/2016  . Vitamin D deficiency 08/02/2016  . Fatigue 08/02/2016  . Family history of polyps in the colon   . Diverticulosis of colon without hemorrhage   . Encounter for screening colonoscopy 08/17/2015  . Constipation 08/17/2015    Past Surgical History:  Procedure Laterality Date  . APPENDECTOMY    . COLONOSCOPY N/A 09/11/2015   Procedure: COLONOSCOPY;  Surgeon: Daneil Dolin,  MD;  Location: [facility];  Service: Endoscopy;  Laterality: N/A;  . ESOPHAGOGASTRODUODENOSCOPY   11/10/2007   Dr. Rourk:Normal esophagus, hiatal hernia.  Small polypoid lesion in the cardia of uncertain significance, status post biopsy removal otherwise normal stomach. Benign fundic gland polyp   . EXTRACORPOREAL SHOCK WAVE LITHOTRIPSY Left 04-30-2010  . LAPAROSCOPIC APPENDECTOMY  07-04-2009  . LAPAROSCOPIC CHOLECYSTECTOMY    . REMOVAL BENIGN RUMOR LEFT CALF       OB History   None      Home Medications    Prior to Admission medications   Medication Sig Start Date End Date Taking? Authorizing Provider  albuterol (PROVENTIL HFA;VENTOLIN HFA) 108 (90 BASE) MCG/ACT inhaler Inhale 2 puffs into the lungs every 6 (six) hours as needed for wheezing.    [provider]  amoxicillin (AMOXIL) 500 MG capsule Take 1 capsule (500 mg total) by mouth 3 (three) times daily. 01/25/18   Ripley Fraise, MD    Family History Family History  Problem Relation Age of Onset  . Colon polyps Sister   . High blood pressure Sister   . High blood pressure Mother   . High blood pressure Brother   . Lupus Sister   . Colon cancer Neg Hx     Social History Social History   Tobacco Use  . Smoking status: Never Smoker  . Smokeless tobacco: Never Used  Substance Use Topics  . Alcohol use: No  Alcohol/week: 0.0 oz  . Drug use: No     Allergies   Patient has no known allergies.   Review of Systems Review of Systems  Constitutional: Negative for fever.  HENT: Positive for dental problem and ear pain. Negative for ear discharge and sore throat.   Respiratory: Negative for cough.   Gastrointestinal: Negative for vomiting.  Neurological: Negative for headaches.     Physical Exam Updated Vital Signs BP (!) 139/94 (BP Location: Left Arm)   Pulse 71   Temp 98.2 F (36.8 C) (Oral)   Resp 14   Ht 1.549 m (5\' 1" )   Wt 77.1 kg (170 lb)   SpO2 100%   BMI 32.12 kg/m   Physical  Exam CONSTITUTIONAL: Well developed/well nourished HEAD: Normocephalic/atraumatic EYES: EOMI/PERRL ENMT: Mucous membranes moist, bilateral TMs intact, no erythema, tenderness to left upper molars without abscess, no trismus, no stridor, no drooling NECK: supple no meningeal signs, no anterior cervical lymphadenopathy SPINE/BACK:entire spine nontender CV: S1/S2 noted, no murmurs/rubs/gallops noted LUNGS: Lungs are clear to auscultation bilaterally, no apparent distress ABDOMEN: soft NEURO: Pt is awake/alert/appropriate, moves all extremitiesx4.  No facial droop.   EXTREMITIES: full ROM SKIN: warm, color normal PSYCH: Anxious   ED Treatments / Results  Labs (all labs ordered are listed, but only abnormal results are displayed) Labs Reviewed - No data to display  EKG None  Radiology No results found.  Procedures Procedures (including critical care time)  Medications Ordered in ED Medications  ibuprofen (ADVIL,MOTRIN) tablet 400 mg (400 mg Oral Given 01/25/18 0512)  amoxicillin (AMOXIL) capsule 500 mg (500 mg Oral Given 01/25/18 0511)     Initial Impression / Assessment and Plan / ED Course  I have reviewed the triage vital signs and the nursing notes.  Pertinent labs & imaging results that were available during my care of the patient were reviewed by me and considered in my medical decision making (see chart for details).     Due to both ear pain and dental pain, will start antibiotics.  Ears are symmetric, no erythema or drainage.  Refer to the PCP if no improvement  Final Clinical Impressions(s) / ED Diagnoses   Final diagnoses:  Left ear pain  Pain, dental    ED Discharge Orders        Ordered    amoxicillin (AMOXIL) 500 MG capsule  3 times daily     01/25/18 0503       Ripley Fraise, MD 01/25/18 [phone]

## 2018-01-25 NOTE — ED Notes (Signed)
ED Provider at bedside. 

## 2018-01-25 NOTE — ED Triage Notes (Signed)
Pt c/o left ear pain that woke her up x 2 hours ago; pt states she has a fullness to that ear

## 2018-03-07 ENCOUNTER — Encounter (HOSPITAL_COMMUNITY): Payer: Self-pay | Admitting: Emergency Medicine

## 2018-03-07 ENCOUNTER — Emergency Department (HOSPITAL_COMMUNITY)
Admission: EM | Admit: 2018-03-07 | Discharge: 2018-03-07 | Disposition: A | Discharge status: Home / Self Care | Payer: Non-veteran care | Attending: Emergency Medicine | Admitting: Emergency Medicine

## 2018-03-07 DIAGNOSIS — M069 Rheumatoid arthritis, unspecified: Secondary | ICD-10-CM | POA: Insufficient documentation

## 2018-03-07 DIAGNOSIS — Z79899 Other long term (current) drug therapy: Secondary | ICD-10-CM | POA: Diagnosis not present

## 2018-03-07 DIAGNOSIS — H9202 Otalgia, left ear: Secondary | ICD-10-CM | POA: Diagnosis present

## 2018-03-07 DIAGNOSIS — J45909 Unspecified asthma, uncomplicated: Secondary | ICD-10-CM | POA: Insufficient documentation

## 2018-03-07 MED ORDER — NAPROXEN 500 MG PO TABS: 500.0000 mg | ORAL_TABLET | Freq: Two times a day (BID) | ORAL | 0 refills | Status: DC

## 2018-03-07 NOTE — ED Provider Notes (Signed)
[facility] EMERGENCY DEPARTMENT Provider Note   CSN: [id number] Arrival date & time: 03/07/18  1729     History   Chief Complaint Chief Complaint  Patient presents with  . Otalgia    HPI Lauren Glover is a 52 y.o. female.  HPI   52 year old female presenting for evaluation of ear pain.  Patient report for the past month she has had intermittent pain primarily involving her left ear that radiates towards her neck.  Pain is sharp, throbbing, worsening with palpation and has increased in frequency within the past few days.  There is no associated fever, chills, hearing changes, ringing in ear, dental pain, chest pain, trouble breathing, numbness or weakness or rash.  No significant cardiac history, patient is a non-smoker.  She takes Tylenol on occasion with provide some relief.  She reported being seen for the same complaint a month ago.  She was given amoxicillin which she took for a week and did provide some relief.  No history of diabetes.  Does have history of rheumatoid arthritis.  Past Medical History:  Diagnosis Date  . Arthritis   . Chronic constipation   . Diverticulitis of colon   . GERD (gastroesophageal reflux disease)   . Hiatal hernia   . History of kidney stones   . Menorrhagia   . Uterine fibroid     Patient Active Problem List   Diagnosis Date Noted  . Rheumatoid arthritis with rheumatoid factor 09/05/2016  . Primary osteoarthritis of both knees 09/05/2016  . Primary osteoarthritis of both hands 09/05/2016  . Primary osteoarthritis of both feet 09/05/2016  . Hand pain 09/04/2016  . Gastroesophageal reflux 08/05/2016  . History of kidney stones 08/05/2016  . Asthma 08/05/2016  . Arthralgia 08/02/2016  . Vitamin D deficiency 08/02/2016  . Fatigue 08/02/2016  . Family history of polyps in the colon   . Diverticulosis of colon without hemorrhage   . Encounter for screening colonoscopy 08/17/2015  . Constipation 08/17/2015    Past Surgical History:    Procedure Laterality Date  . APPENDECTOMY    . COLONOSCOPY N/A 09/11/2015   Procedure: COLONOSCOPY;  Surgeon: Daneil Dolin, MD;  Location: [facility];  Service: Endoscopy;  Laterality: N/A;  . ESOPHAGOGASTRODUODENOSCOPY   11/10/2007   Dr. Rourk:Normal esophagus, hiatal hernia.  Small polypoid lesion in the cardia of uncertain significance, status post biopsy removal otherwise normal stomach. Benign fundic gland polyp   . EXTRACORPOREAL SHOCK WAVE LITHOTRIPSY Left 04-30-2010  . LAPAROSCOPIC APPENDECTOMY  07-04-2009  . LAPAROSCOPIC CHOLECYSTECTOMY    . REMOVAL BENIGN RUMOR LEFT CALF       OB History   None      Home Medications    Prior to Admission medications   Medication Sig Start Date End Date Taking? Authorizing Provider  albuterol (PROVENTIL HFA;VENTOLIN HFA) 108 (90 BASE) MCG/ACT inhaler Inhale 2 puffs into the lungs every 6 (six) hours as needed for wheezing.    [provider]  amoxicillin (AMOXIL) 500 MG capsule Take 1 capsule (500 mg total) by mouth 3 (three) times daily. 01/25/18   Ripley Fraise, MD    Family History Family History  Problem Relation Age of Onset  . Colon polyps Sister   . High blood pressure Sister   . High blood pressure Mother   . High blood pressure Brother   . Lupus Sister   . Colon cancer Neg Hx     Social History Social History   Tobacco Use  . Smoking  status: Never Smoker  . Smokeless tobacco: Never Used  Substance Use Topics  . Alcohol use: No    Alcohol/week: 0.0 oz  . Drug use: No     Allergies   Patient has no known allergies.   Review of Systems Review of Systems  All other systems reviewed and are negative.    Physical Exam Updated Vital Signs BP 126/76 (BP Location: Right Arm)   Pulse 82   Temp 99.6 F (37.6 C) (Oral)   Resp 16   Ht 5\' 2"  (1.575 m)   Wt 78 kg (172 lb)   SpO2 100%   BMI 31.46 kg/m   Physical Exam  Constitutional: She is oriented to person, place, and time. She appears  well-developed and well-nourished. No distress.  HENT:  Head: Atraumatic.  Ears: Normal TMs bilaterally.  No tenderness with manipulation of the earlobes.  No effusion of the ear.  No evidence of mastoiditis. Nose: Normal nares Throat: Uvula midline no tonsillar enlargement or exudates, no dental pain.  No trismus.   Eyes: Conjunctivae are normal.  Neck: Normal range of motion. Neck supple.  Mild tenderness to the palpation of the left TMJ without overlying skin changes or swelling.  No carotid bruit.  Cardiovascular: Normal rate and regular rhythm.  Pulmonary/Chest: Effort normal and breath sounds normal.  Neurological: She is alert and oriented to person, place, and time.  Skin: No rash noted.  Psychiatric: She has a normal mood and affect.  Nursing note and vitals reviewed.    ED Treatments / Results  Labs (all labs ordered are listed, but only abnormal results are displayed) Labs Reviewed - No data to display  EKG None  Radiology No results found.  Procedures Procedures (including critical care time)  Medications Ordered in ED Medications - No data to display   Initial Impression / Assessment and Plan / ED Course  I have reviewed the triage vital signs and the nursing notes.  Pertinent labs & imaging results that were available during my care of the patient were reviewed by me and considered in my medical decision making (see chart for details).     BP 126/76 (BP Location: Right Arm)   Pulse 82   Temp 99.6 F (37.6 C) (Oral)   Resp 16   Ht 5\' 2"  (1.575 m)   Wt 78 kg (172 lb)   SpO2 100%   BMI 31.46 kg/m    Final Clinical Impressions(s) / ED Diagnoses   Final diagnoses:  Otalgia of left ear    ED Discharge Orders        Ordered    naproxen (NAPROSYN) 500 MG tablet  2 times daily     03/07/18 1802     5:57 PM Patient here with intermittent left ear pain ongoing for more than a month.  On exam, no evidence to suggest otitis media or otitis  externa.  She does have tenderness to palpation along the left TMJ.  No signs of infection.  Ear pain is atypical for ACS.  She does not have any significant risk factor for cardiac disease.  Since pain is reproducible on exam, will provide NSAIDs as treatment.  Recommend outpatient follow-up with PCP for further care.  Return precautions discussed.  Patient otherwise well-appearing and in no acute discomfort.   Domenic Moras, PA-C 03/07/18 Tommi Emery, MD 03/07/18 2256

## 2018-03-07 NOTE — ED Triage Notes (Signed)
Pt reports feeling like something is in her left ear with pain.  Has had same before with resolution about a month ago, but symptoms have gradually returned.

## 2019-03-29 ENCOUNTER — Other Ambulatory Visit: Payer: Self-pay

## 2019-03-29 ENCOUNTER — Other Ambulatory Visit: Payer: Non-veteran care

## 2019-03-29 DIAGNOSIS — Z20822 Contact with and (suspected) exposure to covid-19: Secondary | ICD-10-CM

## 2019-04-03 LAB — NOVEL CORONAVIRUS, NAA: SARS-CoV-2, NAA: NOT DETECTED

## 2020-05-16 ENCOUNTER — Telehealth: Payer: Self-pay | Admitting: Rheumatology

## 2020-05-16 NOTE — Telephone Encounter (Signed)
I called patient, CD ready for pick up at front desk.

## 2020-05-16 NOTE — Telephone Encounter (Signed)
Patient called requesting a copy of her x-rays from her new patient appointment on 08/05/2016.  Patient states she will drop off the request for medical records tomorrow 05/17/20 and if the disc is ready will pick up at that time.  Patient states if not it can be mailed to her home.

## 2020-09-26 ENCOUNTER — Encounter: Payer: Self-pay | Admitting: Internal Medicine

## 2020-10-25 ENCOUNTER — Encounter: Payer: Self-pay | Admitting: Internal Medicine

## 2020-11-14 ENCOUNTER — Ambulatory Visit (INDEPENDENT_AMBULATORY_CARE_PROVIDER_SITE_OTHER): Payer: Self-pay | Admitting: *Deleted

## 2020-11-14 ENCOUNTER — Other Ambulatory Visit: Payer: Self-pay

## 2020-11-14 VITALS — Ht 62.0 in | Wt 207.8 lb

## 2020-11-14 DIAGNOSIS — Z1211 Encounter for screening for malignant neoplasm of colon: Secondary | ICD-10-CM

## 2020-11-14 MED ORDER — FLEET ENEMA 7-19 GM/118ML RE ENEM: 1.0000 | ENEMA | Freq: Once | RECTAL | 0 refills | Status: AC

## 2020-11-14 MED ORDER — BISACODYL EC 5 MG PO TBEC: 5.0000 mg | DELAYED_RELEASE_TABLET | Freq: Every day | ORAL | 0 refills | Status: DC | PRN

## 2020-11-14 MED ORDER — PEG 3350-KCL-NA BICARB-NACL 420 G PO SOLR: 4000.0000 mL | Freq: Once | ORAL | 0 refills | Status: AC

## 2020-11-14 NOTE — Patient Instructions (Addendum)
Covid test: 02/19/2021 at 2:00 PM.   Lauren Glover   01/23/1966 MRN: [id number] Procedure Date: 02/21/2021 Arrival Time:   You will receive a call from the hospital a few days before your procedure.     Location of Procedure: [facility]  PREPARATION FOR COLONOSCOPY WITH TRI-LYTE PREP  Please notify us immediately if you are diabetic, take iron supplements, or if you are on Coumadin or any other blood thinners.   Please hold the following medications:  See letter   PROCEDURE IS SCHEDULED FOR Doug Sou AS FOLLOWS:  Procedure Date: 02/21/2021  Time to register: You will receive a call from the hospital a few days before your procedure. Place to register: [facility] Scheduled provider: Dr. Gala Romney   2 DAYS BEFORE PROCEDURE:  DATE: 02/19/2021   DAY: Monday Begin clear liquid diet AFTER your lunch meal. NO SOLID FOODS!   1 DAY BEFORE PROCEDURE:  DATE: 02/20/2021   DAY: Tuesday  Continue clear liquids the entire day - NO SOLID FOOD.   Diabetic medications adjustments for today: n/a  At 12:00pm (noon): Take 2 (two) Dulcolax (Bisacodyl) tablets  At 2:00pm: Start drinking your solution. Try to drink 1 (one) 8 ounce glass every 10-15 minutes, until you have consumed HALF the jug. (You should complete the first 1/2 of the jug in 2 hours. Wait 30 minutes, then drink 3-4 more glasses of the solution. Your stools should be clear; if not, you may have to consume the rest of the jug.   One hour after completing the solution: take the last 2 (two) Dulcolax (Bisacodyl) tablets, with a clear liquid.  YOU MUST DRINK PLENTY OF CLEAR LIQUIDS DURING YOUR PREP TO REDUCE RISKS OF KIDNEY FAILURE.   Continue clear liquids only, until 4 hours before your scheduled procedure time. Do not eat or drink anything 4 hours before your procedure. EXCEPTION:  If you take medications for your heart, blood pressure or breathing, you may take these medications with a small amount of clear  liquid.      DAY OF PROCEDURE:   DATE: 02/21/2021       DAY: Wednesday The morning of your procedure give yourself 1 (one) Fleet Enema, at least 1 hour before going to the hospital.   You may take Tylenol products. Please continue your regular medications unless we have instructed otherwise.   Diabetic medications adjustments for today. n/a  Someone MUST be available to drive you home; the hospital will cancel this appointment if you do not have a driver.   Please call the office if you have any questions (Dept: [phone]).  Please see below for Dietary Information.  CLEAR LIQUIDS INCLUDE:  Water Jello (NOT red in color)   Ice Popsicles (NOT red in color)   Tea (sugar ok, no milk/cream) Powdered fruit flavored drinks  Coffee (sugar ok, no milk/cream) Gatorade/ Lemonade/ Kool-Aid  (NOT red in color)   Juice: apple, white grape, white cranberry Soft drinks  Clear bullion, consomme, broth (fat free beef/chicken/vegetable)  Carbonated beverages (any kind)  Strained chicken noodle soup Hard Candy   REMEMBER: Clear liquids are liquids that will allow you to see your fingers on the other side of a clear glass. Be sure liquids are NOT red in color, and not cloudy, but CLEAR.   DO NOT EAT OR DRINK ANY OF THE FOLLOWING:  Dairy products of any kind   Cranberry juice Tomato juice / V8 juice   Grapefruit juice Orange juice  Red grape juice  Do not eat any solid foods, including such foods as: cereal, oatmeal, yogurt, fruits, vegetables, creamed soups, eggs, bread, etc.    HELPFUL HINTS FOR DRINKING PREP SOLUTION:   Make sure prep is extremely cold. Refrigerate the night before. You may also put in the freezer.   You may try mixing some Crystal Light or Country Time Lemonade if you prefer. Mix in small amounts; add more if necessary.  Try drinking through a straw  Rinse mouth with water or a mouthwash between glasses, to remove after-taste.  Try sipping on a cold beverage  /ice/ popsicles between glasses of prep  Place a piece of sugar-free hard candy in mouth between glasses  If you become nauseated, try consuming smaller amounts, or stretch out the time between glasses. Stop for 30-60 minutes, then slowly start back drinking    You may call the office (Dept: [phone]) before 5:00pm, or page the doctor on call after 5:00pm [phone]), for further instructions, if necessary.   OTHER INSTRUCTIONS  You will need a responsible adult at least 55 years of age to accompany you and drive you home. This person must remain in the waiting room during your procedure.  Wear loose fitting clothing that is easily removed.  Leave jewelry and other valuables at home.   Remove all body piercing jewelry and leave at home.  Total time from sign-in until discharge is approximately 2-3 hours.  You should go home directly after your procedure and rest. You can resume normal activities the day after your procedure.  The day of your procedure you should not:  Drive  Make legal decisions  Operate machinery  Drink alcohol  Return to work

## 2020-11-14 NOTE — Progress Notes (Signed)
Gastroenterology Pre-Procedure Review  Request Date: 11/14/2020 Requesting Physician: [facility], Last TCS 2016 done by Dr. Gala Romney, colonic diverticulosis, family hx of colon polyps  PATIENT REVIEW QUESTIONS: The patient responded to the following health history questions as indicated:    1. Diabetes Melitis: no 2. Joint replacements in the past 12 months: no 3. Major health problems in the past 3 months: no 4. Has an artificial valve or MVP: no 5. Has a defibrillator: no 6. Has been advised in past to take antibiotics in advance of a procedure like teeth cleaning: no 7. Family history of colon cancer: no  8. Alcohol Use: no 9. Illicit drug Use: no 10. History of sleep apnea: no  11. History of coronary artery or other vascular stents placed within the last 12 months: no 12. History of any prior anesthesia complications: no 13. Body mass index is 38.01 kg/m.    MEDICATIONS & ALLERGIES:    Patient reports the following regarding taking any blood thinners:   Plavix? no Aspirin? no Coumadin? no Brilinta? no Xarelto? no Eliquis? no Pradaxa? no Savaysa? no Effient? no  Patient confirms/reports the following medications:  Current Outpatient Medications  Medication Sig Dispense Refill  . Cholecalciferol (VITAMIN D3) 125 MCG (5000 UT) CAPS Take by mouth daily.    . ferrous sulfate 325 (65 FE) MG tablet Take 325 mg by mouth daily with breakfast.     No current facility-administered medications for this visit.    Patient confirms/reports the following allergies:  No Known Allergies  No orders of the defined types were placed in this encounter.   AUTHORIZATION INFORMATION Primary Insurance: Human resources officer,  ID #: [id number] Pre-Cert / Auth required: Yes, authorization on file in [place], approved from 01/06/8118-1/47/8295 Pre-Cert / Auth #: [id number]  SCHEDULE INFORMATION: Procedure has been scheduled as follows:  Date: 01/03/2021, Time: AM procedure Location:  [facility] with Dr. Gala Romney  This Gastroenterology Pre-Precedure Review Form is being routed to the following provider(s): Roseanne Kaufman, NP

## 2020-11-15 ENCOUNTER — Encounter: Payer: Self-pay | Admitting: *Deleted

## 2020-11-15 NOTE — Progress Notes (Signed)
Mailed letter to pt with Iron medication adjustments.

## 2020-11-15 NOTE — Progress Notes (Signed)
Hold  iron 7 days prior.

## 2020-11-15 NOTE — Progress Notes (Signed)
ASA 2. Appropriate.  ?

## 2020-12-27 ENCOUNTER — Telehealth: Payer: Self-pay | Admitting: Internal Medicine

## 2020-12-27 NOTE — Telephone Encounter (Signed)
Called pt back and she informed me that she is starting a new job and needs to reschedule her procedure on 01/03/2021.  She rescheduled to 02/21/2021 PM procedure.  She rescheduled her Covid test to 02/19/2021 at 2:00.  She informed me that she will call me on Monday 01/01/2021 to let me know for sure if these dates will work.  Will mail out new instructions and call Hoyle Sauer once pt confirms.

## 2020-12-27 NOTE — Telephone Encounter (Signed)
Pt has called twice this morning asking for Christ Kick, RMA. I told her that Angie would call her when she isn't with patients. Pt said she needed to speak with Angie, "Today". I assured her that she would get a call once Angie wasn't with patients. Please call [phone]

## 2021-01-01 ENCOUNTER — Other Ambulatory Visit (HOSPITAL_COMMUNITY): Payer: Non-veteran care

## 2021-01-01 NOTE — Telephone Encounter (Signed)
Spoke with pt and she said that she goes into work [employer].  She is going to leave me a message after hours to let me know if 02/21/2021 is a good procedure date.

## 2021-01-03 NOTE — Telephone Encounter (Signed)
No response from pt.  Mailing out prep instructions, Iron medication adjustments, and Covid screening information.  Pt to call me if she would like to reschedule.

## 2021-01-04 ENCOUNTER — Encounter: Payer: Self-pay | Admitting: *Deleted

## 2021-02-15 ENCOUNTER — Telehealth: Payer: Self-pay

## 2021-02-15 NOTE — Telephone Encounter (Signed)
Pt called wanting to advise Levada Dy that she has no one able to stay with her during her colonoscopy. In [place] being out until Monday I asked Almyra Free and she advised that whoever goes with that pt has to go in and give their phone number and at least stay in the parking lot about 3 hours.    Phoned the pt back and LMOVM for her to return my call because her procedure is the 25th. (this has already been rescheduled before). Waiting on the pt to respond.

## 2021-02-15 NOTE — Telephone Encounter (Signed)
error 

## 2021-02-16 ENCOUNTER — Other Ambulatory Visit (HOSPITAL_COMMUNITY): Payer: Self-pay | Admitting: *Deleted

## 2021-02-19 ENCOUNTER — Other Ambulatory Visit: Payer: Self-pay

## 2021-02-19 ENCOUNTER — Other Ambulatory Visit (HOSPITAL_COMMUNITY)
Admission: RE | Admit: 2021-02-19 | Discharge: 2021-02-19 | Disposition: A | Discharge status: Home / Self Care | Payer: No Typology Code available for payment source | Source: Ambulatory Visit | Attending: Internal Medicine | Admitting: Internal Medicine

## 2021-02-19 DIAGNOSIS — Z01812 Encounter for preprocedural laboratory examination: Secondary | ICD-10-CM | POA: Diagnosis present

## 2021-02-19 DIAGNOSIS — Z20822 Contact with and (suspected) exposure to covid-19: Secondary | ICD-10-CM | POA: Diagnosis not present

## 2021-02-20 LAB — SARS CORONAVIRUS 2 (TAT 6-24 HRS): SARS Coronavirus 2: NEGATIVE

## 2021-02-21 ENCOUNTER — Ambulatory Visit (HOSPITAL_COMMUNITY)
Admission: RE | Admit: 2021-02-21 | Discharge: 2021-02-21 | Disposition: A | Discharge status: Home / Self Care | Payer: No Typology Code available for payment source | Attending: Internal Medicine | Admitting: Internal Medicine

## 2021-02-21 ENCOUNTER — Other Ambulatory Visit: Payer: Self-pay

## 2021-02-21 ENCOUNTER — Encounter (HOSPITAL_COMMUNITY)
Admission: RE | Disposition: A | Discharge status: Home / Self Care | Payer: Self-pay | Source: Home / Self Care | Attending: Internal Medicine

## 2021-02-21 ENCOUNTER — Encounter (HOSPITAL_COMMUNITY): Payer: Self-pay | Admitting: Internal Medicine

## 2021-02-21 DIAGNOSIS — Z8371 Family history of colonic polyps: Secondary | ICD-10-CM | POA: Diagnosis not present

## 2021-02-21 DIAGNOSIS — Z1211 Encounter for screening for malignant neoplasm of colon: Secondary | ICD-10-CM | POA: Diagnosis present

## 2021-02-21 DIAGNOSIS — K5730 Diverticulosis of large intestine without perforation or abscess without bleeding: Secondary | ICD-10-CM | POA: Insufficient documentation

## 2021-02-21 DIAGNOSIS — K635 Polyp of colon: Secondary | ICD-10-CM | POA: Diagnosis not present

## 2021-02-21 DIAGNOSIS — Z9049 Acquired absence of other specified parts of digestive tract: Secondary | ICD-10-CM | POA: Diagnosis not present

## 2021-02-21 DIAGNOSIS — Z8719 Personal history of other diseases of the digestive system: Secondary | ICD-10-CM | POA: Insufficient documentation

## 2021-02-21 HISTORY — PX: POLYPECTOMY: SHX5525

## 2021-02-21 HISTORY — PX: COLONOSCOPY: SHX5424

## 2021-02-21 SURGERY — COLONOSCOPY
Anesthesia: Moderate Sedation

## 2021-02-21 MED ORDER — STERILE WATER FOR IRRIGATION IR SOLN
Status: DC | PRN
  Administered 2021-02-21: 1.5 mL

## 2021-02-21 MED ORDER — MIDAZOLAM HCL 5 MG/5ML IJ SOLN
INTRAMUSCULAR | Status: DC | PRN
  Administered 2021-02-21 (×2): 2 mg via INTRAVENOUS

## 2021-02-21 MED ORDER — MEPERIDINE HCL 100 MG/ML IJ SOLN
INTRAMUSCULAR | Status: DC | PRN
  Administered 2021-02-21: 40 mg via INTRAVENOUS

## 2021-02-21 MED ORDER — MIDAZOLAM HCL 5 MG/5ML IJ SOLN
INTRAMUSCULAR | Status: AC
  Filled 2021-02-21: qty 10

## 2021-02-21 MED ORDER — MEPERIDINE HCL 50 MG/ML IJ SOLN
INTRAMUSCULAR | Status: AC
  Filled 2021-02-21: qty 1

## 2021-02-21 MED ORDER — ONDANSETRON HCL 4 MG/2ML IJ SOLN
INTRAMUSCULAR | Status: DC | PRN
  Administered 2021-02-21: 4 mg via INTRAVENOUS

## 2021-02-21 MED ORDER — SODIUM CHLORIDE 0.9 % IV SOLN
INTRAVENOUS | Status: DC
  Administered 2021-02-21: 1000 mL via INTRAVENOUS

## 2021-02-21 MED ORDER — ONDANSETRON HCL 4 MG/2ML IJ SOLN
INTRAMUSCULAR | Status: AC
  Filled 2021-02-21: qty 2

## 2021-02-21 NOTE — Discharge Instructions (Signed)
Colonoscopy Discharge Instructions  Read the instructions outlined below and refer to this sheet in the next few weeks. These discharge instructions provide you with general information on caring for yourself after you leave the hospital. Your doctor may also give you specific instructions. While your treatment has been planned according to the most current medical practices available, unavoidable complications occasionally occur. If you have any problems or questions after discharge, call Dr. Gala Romney at [phone]. ACTIVITY  You may resume your regular activity, but move at a slower pace for the next 24 hours.   Take frequent rest periods for the next 24 hours.   Walking will help get rid of the air and reduce the bloated feeling in your belly (abdomen).   No driving for 24 hours (because of the medicine (anesthesia) used during the test).    Do not sign any important legal documents or operate any machinery for 24 hours (because of the anesthesia used during the test).  NUTRITION  Drink plenty of fluids.   You may resume your normal diet as instructed by your doctor.   Begin with a light meal and progress to your normal diet. Heavy or fried foods are harder to digest and may make you feel sick to your stomach (nauseated).   Avoid alcoholic beverages for 24 hours or as instructed.  MEDICATIONS  You may resume your normal medications unless your doctor tells you otherwise.  WHAT YOU CAN EXPECT TODAY  Some feelings of bloating in the abdomen.   Passage of more gas than usual.   Spotting of blood in your stool or on the toilet paper.  IF YOU HAD POLYPS REMOVED DURING THE COLONOSCOPY:  No aspirin products for 7 days or as instructed.   No alcohol for 7 days or as instructed.   Eat a soft diet for the next 24 hours.  FINDING OUT THE RESULTS OF YOUR TEST Not all test results are available during your visit. If your test results are not back during the visit, make an appointment  with your caregiver to find out the results. Do not assume everything is normal if you have not heard from your caregiver or the medical facility. It is important for you to follow up on all of your test results.  SEEK IMMEDIATE MEDICAL ATTENTION IF:  You have more than a spotting of blood in your stool.   Your belly is swollen (abdominal distention).   You are nauseated or vomiting.   You have a temperature over 101.   You have abdominal pain or discomfort that is severe or gets worse throughout the day.    Diverticulosis and polyp information provided today  1 colon polyp removed today  Further recommendations to follow once I get the laboratory report back for review.  At patient request, I called Vinson Moselle at [phone] -reviewed findings and recommendations   Monitored Anesthesia Care, Care After This sheet gives you information about how to care for yourself after your procedure. Your health care provider may also give you more specific instructions. If you have problems or questions, contact your health care provider. What can I expect after the procedure? After the procedure, it is common to have:  Tiredness.  Forgetfulness about what happened after the procedure.  Impaired judgment for important decisions.  Nausea or vomiting.  Some difficulty with balance. Follow these instructions at home: For the time period you were told by your health care provider:  Rest as needed.  Do not participate in activities  where you could fall or become injured.  Do not drive or use machinery.  Do not drink alcohol.  Do not take sleeping pills or medicines that cause drowsiness.  Do not make important decisions or sign legal documents.  Do not take care of children on your own.      Eating and drinking  Follow the diet that is recommended by your health care provider.  Drink enough fluid to keep your urine pale yellow.  If you vomit: ? Drink water, juice, or  soup when you can drink without vomiting. ? Make sure you have little or no nausea before eating solid foods. General instructions  Have a responsible adult stay with you for the time you are told. It is important to have someone help care for you until you are awake and alert.  Take over-the-counter and prescription medicines only as told by your health care provider.  If you have sleep apnea, surgery and certain medicines can increase your risk for breathing problems. Follow instructions from your health care provider about wearing your sleep device: ? Anytime you are sleeping, including during daytime naps. ? While taking prescription pain medicines, sleeping medicines, or medicines that make you drowsy.  Avoid smoking.  Keep all follow-up visits as told by your health care provider. This is important. Contact a health care provider if:  You keep feeling nauseous or you keep vomiting.  You feel light-headed.  You are still sleepy or having trouble with balance after 24 hours.  You develop a rash.  You have a fever.  You have redness or swelling around the IV site. Get help right away if:  You have trouble breathing.  You have new-onset confusion at home. Summary  For several hours after your procedure, you may feel tired. You may also be forgetful and have poor judgment.  Have a responsible adult stay with you for the time you are told. It is important to have someone help care for you until you are awake and alert.  Rest as told. Do not drive or operate machinery. Do not drink alcohol or take sleeping pills.  Get help right away if you have trouble breathing, or if you suddenly become confused. This information is not intended to replace advice given to you by your health care provider. Make sure you discuss any questions you have with your health care provider. Document Revised: 06/01/2020 Document Reviewed: 08/19/2019 Elsevier Patient Education  2021 [place].  Colon Polyps  Colon polyps are tissue growths inside the colon, which is part of the large intestine. They are one of the types of polyps that can grow in the body. A polyp may be a round bump or a mushroom-shaped growth. You could have one polyp or more than one. Most colon polyps are noncancerous (benign). However, some colon polyps can become cancerous over time. Finding and removing the polyps early can help prevent this. What are the causes? The exact cause of colon polyps is not known. What increases the risk? The following factors may make you more likely to develop this condition:  Having a family history of colorectal cancer or colon polyps.  Being older than 55 years of age.  Being younger than 55 years of age and having a significant family history of colorectal cancer or colon polyps or a genetic condition that puts you at higher risk of getting colon polyps.  Having inflammatory bowel disease, such as ulcerative colitis or Crohn's disease.  Having certain conditions passed  from parent to child (hereditary conditions), such as: ? Familial adenomatous polyposis (FAP). ? Lynch syndrome. ? Turcot syndrome. ? Peutz-Jeghers syndrome. ? MUTYH-associated polyposis (MAP).  Being overweight.  Certain lifestyle factors. These include smoking cigarettes, drinking too much alcohol, not getting enough exercise, and eating a diet that is high in fat and red meat and low in fiber.  Having had childhood cancer that was treated with radiation of the abdomen. What are the signs or symptoms? Many times, there are no symptoms. If you have symptoms, they may include:  Blood coming from the rectum during a bowel movement.  Blood in the stool (feces). The blood may be bright red or very dark in color.  Pain in the abdomen.  A change in bowel habits, such as constipation or diarrhea. How is this diagnosed? This condition is diagnosed with a colonoscopy. This is a procedure in  which a lighted, flexible scope is inserted into the opening between the buttocks (anus) and then passed into the colon to examine the area. Polyps are sometimes found when a colonoscopy is done as part of routine cancer screening tests. How is this treated? This condition is treated by removing any polyps that are found. Most polyps can be removed during a colonoscopy. Those polyps will then be tested for cancer. Additional treatment may be needed depending on the results of testing. Follow these instructions at home: Eating and drinking  Eat foods that are high in fiber, such as fruits, vegetables, and whole grains.  Eat foods that are high in calcium and vitamin D, such as milk, cheese, yogurt, eggs, liver, fish, and broccoli.  Limit foods that are high in fat, such as fried foods and desserts.  Limit the amount of red meat, precooked or cured meat, or other processed meat that you eat, such as hot dogs, sausages, bacon, or meat loaves.  Limit sugary drinks.   Lifestyle  Maintain a healthy weight, or lose weight if recommended by your health care provider.  Exercise every day or as told by your health care provider.  Do not use any products that contain nicotine or tobacco, such as cigarettes, e-cigarettes, and chewing tobacco. If you need help quitting, ask your health care provider.  Do not drink alcohol if: ? Your health care provider tells you not to drink. ? You are pregnant, may be pregnant, or are planning to become pregnant.  If you drink alcohol: ? Limit how much you use to:  0-1 drink a day for women.  0-2 drinks a day for men. ? Know how much alcohol is in your drink. In the [place] one drink equals one 12 oz bottle of beer (355 mL), one 5 oz glass of wine (148 mL), or one 1 oz glass of hard liquor (44 mL). General instructions  Take over-the-counter and prescription medicines only as told by your health care provider.  Keep all follow-up visits. This is important.  This includes having regularly scheduled colonoscopies. Talk to your health care provider about when you need a colonoscopy. Contact a health care provider if:  You have new or worsening bleeding during a bowel movement.  You have new or increased blood in your stool.  You have a change in bowel habits.  You lose weight for no known reason. Summary  Colon polyps are tissue growths inside the colon, which is part of the large intestine. They are one type of polyp that can grow in the body.  Most colon polyps are noncancerous (benign),  but some can become cancerous over time.  This condition is diagnosed with a colonoscopy.  This condition is treated by removing any polyps that are found. Most polyps can be removed during a colonoscopy. This information is not intended to replace advice given to you by your health care provider. Make sure you discuss any questions you have with your health care provider. Document Revised: 01/05/2020 Document Reviewed: 01/05/2020 Elsevier Patient Education  2021 [place].  Diverticulosis  Diverticulosis is a condition that develops when small pouches (diverticula) form in the wall of the large intestine (colon). The colon is where water is absorbed and stool (feces) is formed. The pouches form when the inside layer of the colon pushes through weak spots in the outer layers of the colon. You may have a few pouches or many of them. The pouches usually do not cause problems unless they become inflamed or infected. When this happens, the condition is called diverticulitis. What are the causes? The cause of this condition is not known. What increases the risk? The following factors may make you more likely to develop this condition:  Being older than age 62. Your risk for this condition increases with age. Diverticulosis is rare among people younger than age 75. By age 68, many people have it.  Eating a low-fiber diet.  Having frequent  constipation.  Being overweight.  Not getting enough exercise.  Smoking.  Taking over-the-counter pain medicines, like aspirin and ibuprofen.  Having a family history of diverticulosis. What are the signs or symptoms? In most people, there are no symptoms of this condition. If you do have symptoms, they may include:  Bloating.  Cramps in the abdomen.  Constipation or diarrhea.  Pain in the lower left side of the abdomen. How is this diagnosed? Because diverticulosis usually has no symptoms, it is most often diagnosed during an exam for other colon problems. The condition may be diagnosed by:  Using a flexible scope to examine the colon (colonoscopy).  Taking an X-ray of the colon after dye has been put into the colon (barium enema).  Having a CT scan. How is this treated? You may not need treatment for this condition. Your health care provider may recommend treatment to prevent problems. You may need treatment if you have symptoms or if you previously had diverticulitis. Treatment may include:  Eating a high-fiber diet.  Taking a fiber supplement.  Taking a live bacteria supplement (probiotic).  Taking medicine to relax your colon.   Follow these instructions at home: Medicines  Take over-the-counter and prescription medicines only as told by your health care provider.  If told by your health care provider, take a fiber supplement or probiotic. Constipation prevention Your condition may cause constipation. To prevent or treat constipation, you may need to:  Drink enough fluid to keep your urine pale yellow.  Take over-the-counter or prescription medicines.  Eat foods that are high in fiber, such as beans, whole grains, and fresh fruits and vegetables.  Limit foods that are high in fat and processed sugars, such as fried or sweet foods.   General instructions  Try not to strain when you have a bowel movement.  Keep all follow-up visits as told by your health  care provider. This is important. Contact a health care provider if you:  Have pain in your abdomen.  Have bloating.  Have cramps.  Have not had a bowel movement in 3 days. Get help right away if:  Your pain gets worse.  Your  bloating becomes very bad.  You have a fever or chills, and your symptoms suddenly get worse.  You vomit.  You have bowel movements that are bloody or black.  You have bleeding from your rectum. Summary  Diverticulosis is a condition that develops when small pouches (diverticula) form in the wall of the large intestine (colon).  You may have a few pouches or many of them.  This condition is most often diagnosed during an exam for other colon problems.  Treatment may include increasing the fiber in your diet, taking supplements, or taking medicines. This information is not intended to replace advice given to you by your health care provider. Make sure you discuss any questions you have with your health care provider. Document Revised: 04/15/2019 Document Reviewed: 04/15/2019 Elsevier Patient Education  [place].

## 2021-02-21 NOTE — H&P (Signed)
@LOGO @   Primary Care Physician:  Pcp, No Primary Gastroenterologist:  Dr. Gala Romney  Pre-Procedure History & Physical: HPI:  Lauren Glover is a 55 y.o. female here for high risk screening colonoscopy. Positive family history colon cancer in first-degree relative.  Right-sided diverticulosis only on last colonoscopy. Past Medical History:  Diagnosis Date  . Arthritis   . Chronic constipation   . Diverticulitis of colon   . GERD (gastroesophageal reflux disease)   . Hiatal hernia   . History of kidney stones   . Menorrhagia   . Uterine fibroid     Past Surgical History:  Procedure Laterality Date  . APPENDECTOMY    . COLONOSCOPY N/A 09/11/2015   Procedure: COLONOSCOPY;  Surgeon: Daneil Dolin, MD;  Location: [facility];  Service: Endoscopy;  Laterality: N/A;  . ESOPHAGOGASTRODUODENOSCOPY   11/10/2007   Dr. Stanislaw Acton:Normal esophagus, hiatal hernia.  Small polypoid lesion in the cardia of uncertain significance, status post biopsy removal otherwise normal stomach. Benign fundic gland polyp   . EXTRACORPOREAL SHOCK WAVE LITHOTRIPSY Left 04-30-2010  . LAPAROSCOPIC APPENDECTOMY  07-04-2009  . LAPAROSCOPIC CHOLECYSTECTOMY    . REMOVAL BENIGN RUMOR LEFT CALF      Prior to Admission medications   Medication Sig Start Date End Date Taking? Authorizing Provider  Biotin w/ Vitamins C & E (HAIR/SKIN/NAILS PO) Take 3 capsules by mouth daily.   Yes [provider]  Cholecalciferol (VITAMIN D3) 125 MCG (5000 UT) CAPS Take 5,000 Units by mouth daily.   Yes [provider]  ferrous sulfate 325 (65 FE) MG tablet Take 325 mg by mouth daily with breakfast.   Yes [provider]  Tetrahydrozoline HCl (REDNESS RELIEVER EYE DROPS OP) Place 1 drop into both eyes daily as needed (redness).   Yes [provider]  bisacodyl (BISACODYL) 5 MG EC tablet Take 1 tablet (5 mg total) by mouth daily as needed for moderate constipation. Patient not taking: Reported on  12/21/2020 11/14/20   Annitta Needs, NP    Allergies as of 11/15/2020  . (No Known Allergies)    Family History  Problem Relation Age of Onset  . Colon polyps Sister   . High blood pressure Sister   . High blood pressure Mother   . High blood pressure Brother   . Lupus Sister   . Colon cancer Neg Hx     Social History   Socioeconomic History  . Marital status: Single    Spouse name: Not on file  . Number of children: Not on file  . Years of education: Not on file  . Highest education level: Not on file  Occupational History  . Occupation: [employer]  Tobacco Use  . Smoking status: Never Smoker  . Smokeless tobacco: Never Used  Vaping Use  . Vaping Use: Never used  Substance and Sexual Activity  . Alcohol use: No    Alcohol/week: 0.0 standard drinks  . Drug use: No  . Sexual activity: Yes    Birth control/protection: None  Other Topics Concern  . Not on file  Social History Narrative  . Not on file   Social Determinants of Health   Financial Resource Strain: Not on file  Food Insecurity: Not on file  Transportation Needs: Not on file  Physical Activity: Not on file  Stress: Not on file  Social Connections: Not on file  Intimate Partner Violence: Not on file    Review of Systems: See HPI, otherwise negative ROS  Physical Exam: BP Marland Kitchen)  147/89   Pulse 81   Temp 98.4 F (36.9 C) (Oral)   Resp 12   Ht 5\' 2"  (1.575 m)   Wt 90.7 kg   SpO2 98%   BMI 36.58 kg/m  General:   Alert,  Well-developed, well-nourished, pleasant and cooperative in NAD Neck:  Supple; no masses or thyromegaly. No significant cervical adenopathy. Lungs:  Clear throughout to auscultation.   No wheezes, crackles, or rhonchi. No acute distress. Heart:  Regular rate and rhythm; no murmurs, clicks, rubs,  or gallops. Abdomen: Non-distended, normal bowel sounds.  Soft and nontender without appreciable mass or hepatosplenomegaly.  Pulses:  Normal pulses noted. Extremities:  Without clubbing or  edema.  Impression/Plan: 55 year old lady with a positive family history of colon polyps in a first-degree relative here for high risk screening colonoscopy per plan. The risks, benefits, limitations, alternatives and imponderables have been reviewed with the patient. Questions have been answered. All parties are agreeable.      Notice: This dictation was prepared with Dragon dictation along with smaller phrase technology. Any transcriptional errors that result from this process are unintentional and may not be corrected upon review.

## 2021-02-21 NOTE — Op Note (Signed)
[facility] Patient Name: Lauren Glover Procedure Date: 02/21/2021 1:40 PM MRN: [id number] Date of Birth: December 26, 1965 Attending MD: Norvel Richards , MD CSN: [id number] Age: 55 Admit Type: Outpatient Procedure:                Colonoscopy Indications:              Colon cancer screening in patient at increased                            risk: Family history of 1st-degree relative with                            colon polyps Providers:                Norvel Richards, MD, Charlsie Quest. Theda Sers RN, RN,                            Raphael Gibney, Technician Referring MD:              Medicines:                Midazolam 4 mg IV, Meperidine 40 mg IV Complications:            No immediate complications. Estimated Blood Loss:     Estimated blood loss was minimal. Procedure:                Pre-Anesthesia Assessment:                           - Prior to the procedure, a History and Physical                            was performed, and patient medications and                            allergies were reviewed. The patient's tolerance of                            previous anesthesia was also reviewed. The risks                            and benefits of the procedure and the sedation                            options and risks were discussed with the patient.                            All questions were answered, and informed consent                            was obtained. Prior Anticoagulants: The patient has                            taken no previous anticoagulant or antiplatelet  agents. ASA Grade Assessment: II - A patient with                            mild systemic disease. After reviewing the risks                            and benefits, the patient was deemed in                            satisfactory condition to undergo the procedure.                           After obtaining informed consent, the colonoscope                            was passed  under direct vision. Throughout the                            procedure, the patient's blood pressure, pulse, and                            oxygen saturations were monitored continuously. The                            CF-[id number] ([id number]) scope was introduced through                            the anus and advanced to the the cecum, identified                            by appendiceal orifice and ileocecal valve. The                            colonoscopy was performed without difficulty. The                            patient tolerated the procedure well. The quality                            of the bowel preparation was adequate. Scope In: 1:59:13 PM Scope Out: 2:09:58 PM Scope Withdrawal Time: 0 hours 7 minutes 18 seconds  Total Procedure Duration: 0 hours 10 minutes 45 seconds  Findings:      The perianal and digital rectal examinations were normal.      A 5 mm polyp was found in the descending colon. The polyp was sessile.       The polyp was removed with a cold snare. Resection and retrieval were       complete. Estimated blood loss was minimal.      Scattered diverticula were found in the entire colon.      The exam was otherwise without abnormality on direct and retroflexion       views. Impression:               - One 5 mm polyp in the descending colon, removed  with a cold snare. Resected and retrieved.                           - Diverticulosis in the entire examined colon.                           - The examination was otherwise normal on direct                            and retroflexion views. Moderate Sedation:      Moderate (conscious) sedation was administered by the endoscopy nurse       and supervised by the endoscopist. The following parameters were       monitored: oxygen saturation, heart rate, blood pressure, respiratory       rate, EKG, adequacy of pulmonary ventilation, and response to care. Recommendation:           - Patient has  a contact number available for                            emergencies. The signs and symptoms of potential                            delayed complications were discussed with the                            patient. Return to normal activities tomorrow.                            Written discharge instructions were provided to the                            patient.                           - Resume previous diet.                           - Continue present medications.                           - Repeat colonoscopy date to be determined after                            pending pathology results are reviewed for                            surveillance.                           - Return to GI office (date not yet determined). Procedure Code(s):        --- Professional ---                           [id number], Colonoscopy, flexible; with removal of  tumor(s), polyp(s), or other lesion(s) by snare                            technique Diagnosis Code(s):        --- Professional ---                           Z83.71, Family history of colonic polyps                           K63.5, Polyp of colon                           K57.30, Diverticulosis of large intestine without                            perforation or abscess without bleeding CPT copyright 2019 [employer]. All rights reserved. The codes documented in this report are preliminary and upon coder review may  be revised to meet current compliance requirements. Cristopher Estimable. Zaquan Duffner, MD Norvel Richards, MD 02/21/2021 2:17:47 PM This report has been signed electronically. Number of Addenda: 0

## 2021-02-23 ENCOUNTER — Encounter: Payer: Self-pay | Admitting: Internal Medicine

## 2021-02-23 LAB — SURGICAL PATHOLOGY

## 2021-03-01 ENCOUNTER — Encounter (HOSPITAL_COMMUNITY): Payer: Self-pay | Admitting: Internal Medicine

## 2021-12-25 ENCOUNTER — Other Ambulatory Visit: Payer: Self-pay

## 2021-12-25 ENCOUNTER — Emergency Department (HOSPITAL_COMMUNITY)
Admission: EM | Admit: 2021-12-25 | Discharge: 2021-12-25 | Disposition: A | Discharge status: Home / Self Care | Payer: No Typology Code available for payment source | Attending: Student | Admitting: Student

## 2021-12-25 ENCOUNTER — Encounter (HOSPITAL_COMMUNITY): Payer: Self-pay | Admitting: *Deleted

## 2021-12-25 DIAGNOSIS — R030 Elevated blood-pressure reading, without diagnosis of hypertension: Secondary | ICD-10-CM | POA: Insufficient documentation

## 2021-12-25 LAB — BASIC METABOLIC PANEL
Anion gap: 11 (ref 5–15)
BUN: 12 mg/dL (ref 6–20)
CO2: 23 mmol/L (ref 22–32)
Calcium: 9.4 mg/dL (ref 8.9–10.3)
Chloride: 104 mmol/L (ref 98–111)
Creatinine, Ser: 0.69 mg/dL (ref 0.44–1.00)
GFR, Estimated: >60 mL/min (ref >60)
Glucose, Bld: 92 mg/dL (ref 70–99)
Potassium: 3.7 mmol/L (ref 3.5–5.1)
Sodium: 138 mmol/L (ref 135–145)

## 2021-12-25 LAB — CBC WITH DIFFERENTIAL/PLATELET
Abs Immature Granulocytes: 0 10*3/uL (ref 0.00–0.07)
Basophils Absolute: 0 10*3/uL (ref 0.0–0.1)
Basophils Relative: 1 %
Eosinophils Absolute: 0.1 10*3/uL (ref 0.0–0.5)
Eosinophils Relative: 2 %
HCT: 43.5 % (ref 36.0–46.0)
Hemoglobin: 14.3 g/dL (ref 12.0–15.0)
Immature Granulocytes: 0 %
Lymphocytes Relative: 64 %
Lymphs Abs: 2.9 10*3/uL (ref 0.7–4.0)
MCH: 33.5 pg (ref 26.0–34.0)
MCHC: 32.9 g/dL (ref 30.0–36.0)
MCV: 101.9 fL — ABNORMAL HIGH (ref 80.0–100.0)
Monocytes Absolute: 0.3 10*3/uL (ref 0.1–1.0)
Monocytes Relative: 6 %
Neutro Abs: 1.2 10*3/uL — ABNORMAL LOW (ref 1.7–7.7)
Neutrophils Relative %: 27 %
Platelets: 188 10*3/uL (ref 150–400)
RBC: 4.27 MIL/uL (ref 3.87–5.11)
RDW: 13.8 % (ref 11.5–15.5)
WBC: 4.5 10*3/uL (ref 4.0–10.5)
nRBC: 0 % (ref 0.0–0.2)

## 2021-12-25 MED ORDER — AMLODIPINE BESYLATE 2.5 MG PO TABS: 2.5000 mg | ORAL_TABLET | Freq: Every day | ORAL | 0 refills | Status: AC

## 2021-12-25 NOTE — Discharge Instructions (Signed)
Lab work and exam are all reassuring, I have started you on a blood pressure medication please take as prescribed.  If you start to feel lightheaded days of take this medication please discontinue as it may be lowering her blood pressure too much.  I recommend taking your blood pressure daily and keeping a log of it. ? ?I want you to follow-up with your PCP in 2 weeks time for reevaluation. ? ?Come back to the emergency department if you develop chest pain, shortness of breath, severe abdominal pain, uncontrolled nausea, vomiting, diarrhea. ? ?

## 2021-12-25 NOTE — ED Notes (Signed)
Resting quietly. No complaints. No signs of distress. VSS. Side rails up. Call bell in reach.  ?

## 2021-12-25 NOTE — ED Triage Notes (Signed)
Pt here for HTN, SBP in 170's for a week.  C/o HA off and on.  Tylenol helps per pt.  ?

## 2021-12-25 NOTE — ED Provider Notes (Signed)
?[facility] ?Provider Note ? ? ?CSN: [id number] ?Arrival date & time: 12/25/21  1640 ? ?  ? ?History ? ?Chief Complaint  ?Patient presents with  ? Hypertension  ? ? ?Lauren Glover is a 56 y.o. female. ? ?HPI ? ?Patient without pertinent medical history presents with complaints of elevated blood pressure.  Patient has she noticed that her blood pressure been elevated for about a week's time.  She states that when she goes to work there is a blood pressure machine and she has been taking it.  She states she been taking at different times and noticed that the top number has gone as high as 170.  She states that she will have occasionally have a headache which remains in the middle of her head, will sometimes have change in vision but denies any paresthesia or weakness of the upper or lower extremities no recent head trauma, not on anticoag's.  She states she has no headaches at this time not having  any symptoms at this time.  She denies  chest pain shortness of breath stomach pains nausea vomit diarrhea no peripheral edema.  States that she has not been diagnosed  hypertension and is not taking any blood pressure medication.  She is here because she knows that her blood pressure is high and is wanting be evaluated. ? ? ? ?Home Medications ?Prior to Admission medications   ?Medication Sig Start Date End Date Taking? Authorizing Provider  ?amLODipine (NORVASC) 2.5 MG tablet Take 1 tablet (2.5 mg total) by mouth daily. 12/25/21 01/24/22 Yes Marcello Fennel, PA-C  ?Biotin w/ Vitamins C & E (HAIR/SKIN/NAILS PO) Take 3 capsules by mouth daily.    [provider]  ?Cholecalciferol (VITAMIN D3) 125 MCG (5000 UT) CAPS Take 5,000 Units by mouth daily.    [provider]  ?ferrous sulfate 325 (65 FE) MG tablet Take 325 mg by mouth daily with breakfast.    [provider]  ?Tetrahydrozoline HCl (REDNESS RELIEVER EYE DROPS OP) Place 1 drop into both eyes daily as needed (redness).     [provider]  ?   ? ?Allergies    ?Patient has no known allergies.   ? ?Review of Systems   ?Review of Systems  ?Constitutional:  Negative for chills and fever.  ?Respiratory:  Negative for shortness of breath.   ?Cardiovascular:  Negative for chest pain.  ?Gastrointestinal:  Negative for abdominal pain.  ?Neurological:  Negative for headaches.  ? ?Physical Exam ?Updated Vital Signs ?BP 129/75   Pulse 62   Temp 98.6 ?F (37 ?C) (Oral)   Resp 16   Ht 5' 1.25" (1.556 m)   Wt 83.9 kg   SpO2 100%   BMI 34.67 kg/m?  ?Physical Exam ?Vitals and nursing note reviewed.  ?Constitutional:   ?   General: She is not in acute distress. ?   Appearance: She is not ill-appearing.  ?HENT:  ?   Head: Normocephalic and atraumatic.  ?   Nose: No congestion.  ?Eyes:  ?   Extraocular Movements: Extraocular movements intact.  ?   Conjunctiva/sclera: Conjunctivae normal.  ?   Pupils: Pupils are equal, round, and reactive to light.  ?Cardiovascular:  ?   Rate and Rhythm: Normal rate and regular rhythm.  ?   Pulses: Normal pulses.  ?   Heart sounds: No murmur heard. ?  No friction rub. No gallop.  ?Pulmonary:  ?   Effort: No respiratory distress.  ?   Breath  sounds: No wheezing, rhonchi or rales.  ?Abdominal:  ?   Palpations: Abdomen is soft.  ?   Tenderness: There is no abdominal tenderness. There is no right CVA tenderness or left CVA tenderness.  ?Musculoskeletal:  ?   Right lower leg: No edema.  ?   Left lower leg: No edema.  ?   Comments: Moving all 4 extremities without difficulty.  ?Skin: ?   General: Skin is warm and dry.  ?Neurological:  ?   Mental Status: She is alert.  ?   Comments: No facial asymmetry, no difficulty with word finding, follow two-step commands, no unilateral weakness present.  Gait fully intact.  ?Psychiatric:     ?   Mood and Affect: Mood normal.  ? ? ?ED Results / Procedures / Treatments   ?Labs ?(all labs ordered are listed, but only abnormal results are displayed) ?Labs Reviewed  ?CBC WITH  DIFFERENTIAL/PLATELET - Abnormal; Notable for the following components:  ?    Result Value  ? MCV 101.9 (*)   ? Neutro Abs 1.2 (*)   ? All other components within normal limits  ?BASIC METABOLIC PANEL  ? ? ?EKG ?None ? ?Radiology ?No results found. ? ?Procedures ?Procedures  ? ? ?Medications Ordered in ED ?Medications - No data to display ? ?ED Course/ Medical Decision Making/ A&P ?  ?                        ?Medical Decision Making ?Amount and/or Complexity of Data Reviewed ?Labs: ordered. ? ? ?This patient presents to the ED for concern of hypertension, this involves an extensive number of treatment options, and is a complaint that carries with it a high risk of complications and morbidity.  The differential diagnosis includes CVA, hypertensive urgency emergency ? ? ? ?Additional history obtained: ? ?Additional history obtained from N/A ?External records from outside source obtained and reviewed including N/A ? ? ?Co morbidities that complicate the patient evaluation ? ?N/A ? ?Social Determinants of Health: ? ?N/A ? ? ? ?Lab Tests: ? ?I Ordered, and personally interpreted labs.  The pertinent results include: CBC unremarkable, BMP unremarkable ? ? ?Imaging Studies ordered: ? ?I ordered imaging studies including N/A ?I independently visualized and interpreted imaging which showed N/A ?I agree with the radiologist interpretation ? ? ?Cardiac Monitoring: ? ?The patient was maintained on a cardiac monitor.  I personally viewed and interpreted the cardiac monitored which showed an underlying rhythm of: EKG without signs of ischemia ? ? ?Medicines ordered and prescription drug management: ? ?I ordered medication including N/A ?I have reviewed the patients home medicines and have made adjustments as needed ? ?Critical Interventions: ? ?N/A ? ? ?Reevaluation: ? ?Presents with concerns of elevated blood pressure, benign is on unremarkable, BP slightly elevated will obtain basic lab work-up and reassess ? ?Patient is  reassessed BP is downtrending without intervention patient is having no complaints and she is ready for discharge. ? ?Consultations Obtained: ? ?N/A ? ? ? ?Test Considered: ? ?N/A ? ? ? ?Rule out ?Low suspicion for hypertensive emergency urgency as BP is not significant elevated no signs of organ damage present.  I will suspicion for CVA no focal-present my exam.  Low suspicion for ACS denies any chest pain shortness of breath EKG without signs of ischemia no peripheral edema present. ? ? ? ?Dispostion and problem list ? ?After consideration of the diagnostic results and the patients response to treatment, I feel that the patent  would benefit from discharge. ? ?Elevated blood pressure-patient has slightly elevated BP, states has been elevated for a week's time, will provide with a small dose of amlodipine as she is a nondiabetic,follow-up with PCP for reevaluation and strict return precautions. ? ? ? ? ? ? ? ? ? ? ? ?Final Clinical Impression(s) / ED Diagnoses ?Final diagnoses:  ?Elevated blood pressure reading  ? ? ?Rx / DC Orders ?ED Discharge Orders   ? ?      Ordered  ?  amLODipine (NORVASC) 2.5 MG tablet  Daily       ? 12/25/21 1848  ? ?  ?  ? ?  ? ? ?  ?Marcello Fennel, PA-C ?12/25/21 1855 ? ?  ?Teressa Lower, MD ?12/26/21 0013 ? ?

## 2022-09-04 ENCOUNTER — Ambulatory Visit
Admission: EM | Admit: 2022-09-04 | Discharge: 2022-09-04 | Disposition: A | Discharge status: Home / Self Care | Payer: No Typology Code available for payment source | Attending: Family Medicine | Admitting: Family Medicine

## 2022-09-04 DIAGNOSIS — R059 Cough, unspecified: Secondary | ICD-10-CM | POA: Diagnosis not present

## 2022-09-04 DIAGNOSIS — Z1152 Encounter for screening for COVID-19: Secondary | ICD-10-CM | POA: Diagnosis not present

## 2022-09-04 DIAGNOSIS — J069 Acute upper respiratory infection, unspecified: Secondary | ICD-10-CM | POA: Diagnosis present

## 2022-09-04 LAB — RESP PANEL BY RT-PCR (FLU A&B, COVID) ARPGX2
Influenza A by PCR: NEGATIVE
Influenza B by PCR: NEGATIVE
SARS Coronavirus 2 by RT PCR: NEGATIVE

## 2022-09-04 MED ORDER — PROMETHAZINE-DM 6.25-15 MG/5ML PO SYRP: 5.0000 mL | ORAL_SOLUTION | Freq: Four times a day (QID) | ORAL | 0 refills | Status: DC | PRN

## 2022-09-04 MED ORDER — PREDNISONE 20 MG PO TABS: 40.0000 mg | ORAL_TABLET | Freq: Every day | ORAL | 0 refills | Status: DC

## 2022-09-04 NOTE — ED Provider Notes (Signed)
[facility]    CSN: [id number] Arrival date & time: 09/04/22  1021      History   Chief Complaint No chief complaint on file.   HPI Lauren Glover is a 56 y.o. female.   Patient presenting today with 4-day history of sore throat, headache, sneezing, runny nose, cough, body aches, low-grade fever initially.  Denies chest pain, shortness of breath, abdominal pain, nausea vomiting or diarrhea.  Does have a history of asthma but states she has not required an inhaler for many years and is not currently having any chest tightness or wheezing.  No known sick contacts recently.  Taking TheraFlu and hot teas with mild temporary relief of symptoms.    Past Medical History:  Diagnosis Date   Arthritis    Chronic constipation    Diverticulitis of colon    GERD (gastroesophageal reflux disease)    Hiatal hernia    History of kidney stones    Menorrhagia    Uterine fibroid     Patient Active Problem List   Diagnosis Date Noted   Rheumatoid arthritis with rheumatoid factor 09/05/2016   Primary osteoarthritis of both knees 09/05/2016   Primary osteoarthritis of both hands 09/05/2016   Primary osteoarthritis of both feet 09/05/2016   Hand pain 09/04/2016   Gastroesophageal reflux 08/05/2016   History of kidney stones 08/05/2016   Asthma 08/05/2016   Arthralgia 08/02/2016   Vitamin D deficiency 08/02/2016   Fatigue 08/02/2016   Family history of polyps in the colon    Diverticulosis of colon without hemorrhage    Encounter for screening colonoscopy 08/17/2015   Constipation 08/17/2015    Past Surgical History:  Procedure Laterality Date   APPENDECTOMY     COLONOSCOPY N/A 09/11/2015   Procedure: COLONOSCOPY;  Surgeon: Daneil Dolin, MD;  Location: [facility];  Service: Endoscopy;  Laterality: N/A;   COLONOSCOPY N/A 02/21/2021   Procedure: COLONOSCOPY;  Surgeon: Daneil Dolin, MD;  Location: [facility];  Service: Endoscopy;  Laterality: N/A;  ASA II / PM    ESOPHAGOGASTRODUODENOSCOPY   11/10/2007   Dr. Rourk:Normal esophagus, hiatal hernia.  Small polypoid lesion in the cardia of uncertain significance, status post biopsy removal otherwise normal stomach. Benign fundic gland polyp    EXTRACORPOREAL SHOCK WAVE LITHOTRIPSY Left 04-30-2010   LAPAROSCOPIC APPENDECTOMY  07-04-2009   LAPAROSCOPIC CHOLECYSTECTOMY     POLYPECTOMY  02/21/2021   Procedure: POLYPECTOMY;  Surgeon: Daneil Dolin, MD;  Location: [facility];  Service: Endoscopy;;   REMOVAL BENIGN RUMOR LEFT CALF      OB History   No obstetric history on file.      Home Medications    Prior to Admission medications   Medication Sig Start Date End Date Taking? Authorizing Provider  predniSONE (DELTASONE) 20 MG tablet Take 2 tablets (40 mg total) by mouth daily with breakfast. 09/04/22  Yes Volney American, PA-C  promethazine-dextromethorphan (PROMETHAZINE-DM) 6.25-15 MG/5ML syrup Take 5 mLs by mouth 4 (four) times daily as needed. 09/04/22  Yes Volney American, PA-C  amLODipine (NORVASC) 2.5 MG tablet Take 1 tablet (2.5 mg total) by mouth daily. 12/25/21 01/24/22  Marcello Fennel, PA-C  Biotin w/ Vitamins C & E (HAIR/SKIN/NAILS PO) Take 3 capsules by mouth daily.    [provider]  Cholecalciferol (VITAMIN D3) 125 MCG (5000 UT) CAPS Take 5,000 Units by mouth daily.    [provider]  ferrous sulfate 325 (65 FE) MG tablet Take 325  mg by mouth daily with breakfast.    [provider]  Tetrahydrozoline HCl (REDNESS RELIEVER EYE DROPS OP) Place 1 drop into both eyes daily as needed (redness).    [provider]    Family History Family History  Problem Relation Age of Onset   Colon polyps Sister    High blood pressure Sister    High blood pressure Mother    High blood pressure Brother    Lupus Sister    Colon cancer Neg Hx     Social History Social History   Tobacco Use   Smoking status: Never   Smokeless tobacco: Never   Vaping Use   Vaping Use: Never used  Substance Use Topics   Alcohol use: No    Alcohol/week: 0.0 standard drinks of alcohol   Drug use: No     Allergies   Patient has no known allergies.   Review of Systems Review of Systems PER HPI  Physical Exam Triage Vital Signs ED Triage Vitals  Enc Vitals Group     BP 09/04/22 1225 136/85     Pulse Rate 09/04/22 1225 85     Resp 09/04/22 1225 20     Temp 09/04/22 1225 98.4 F (36.9 C)     Temp Source 09/04/22 1225 Oral     SpO2 09/04/22 1225 100 %     Weight --      Height --      Head Circumference --      Peak Flow --      Pain Score 09/04/22 1228 5     Pain Loc --      Pain Edu? --      Excl. in [place]? --    No data found.  Updated Vital Signs BP 136/85 (BP Location: Right Arm)   Pulse 85   Temp 98.4 F (36.9 C) (Oral)   Resp 20   LMP 08/27/2022 (Exact Date) Comment: pt is still having menstrual episodes.  SpO2 100%   Visual Acuity Right Eye Distance:   Left Eye Distance:   Bilateral Distance:    Right Eye Near:   Left Eye Near:    Bilateral Near:     Physical Exam Vitals and nursing note reviewed.  Constitutional:      Appearance: Normal appearance.  HENT:     Head: Atraumatic.     Right Ear: Tympanic membrane and external ear normal.     Left Ear: Tympanic membrane and external ear normal.     Nose: Rhinorrhea present.     Mouth/Throat:     Mouth: Mucous membranes are moist.     Pharynx: Posterior oropharyngeal erythema present.  Eyes:     Extraocular Movements: Extraocular movements intact.     Conjunctiva/sclera: Conjunctivae normal.  Cardiovascular:     Rate and Rhythm: Normal rate and regular rhythm.     Heart sounds: Normal heart sounds.  Pulmonary:     Effort: Pulmonary effort is normal.     Breath sounds: Normal breath sounds. No wheezing or rales.  Musculoskeletal:        General: Normal range of motion.     Cervical back: Normal range of motion and neck supple.  Skin:    General:  Skin is warm and dry.  Neurological:     Mental Status: She is alert and oriented to person, place, and time.  Psychiatric:        Mood and Affect: Mood normal.  Thought Content: Thought content normal.      UC Treatments / Results  Labs (all labs ordered are listed, but only abnormal results are displayed) Labs Reviewed  RESP PANEL BY RT-PCR (FLU A&B, COVID) [id number]    EKG   Radiology No results found.  Procedures Procedures (including critical care time)  Medications Ordered in UC Medications - No data to display  Initial Impression / Assessment and Plan / UC Course  I have reviewed the triage vital signs and the nursing notes.  Pertinent labs & imaging results that were available during my care of the patient were reviewed by me and considered in my medical decision making (see chart for details).     Vitals and exam reassuring and suggestive of a viral upper respiratory infection.  Respiratory panel pending, treat with Phenergan DM, short course of prednisone, supportive over-the-counter medications and home care.  Return for worsening symptoms.  Work note given.   Final Clinical Impressions(s) / UC Diagnoses   Final diagnoses:  Viral URI with cough   Discharge Instructions   None    ED Prescriptions     Medication Sig Dispense Auth. Provider   promethazine-dextromethorphan (PROMETHAZINE-DM) 6.25-15 MG/5ML syrup Take 5 mLs by mouth 4 (four) times daily as needed. 100 mL Volney American, PA-C   predniSONE (DELTASONE) 20 MG tablet Take 2 tablets (40 mg total) by mouth daily with breakfast. 6 tablet Volney American, [place]      PDMP not reviewed this encounter.   Volney American, [place] 09/04/22 1316

## 2022-09-04 NOTE — ED Triage Notes (Signed)
Pt reports her throat is hurting really bad, she has a headache, sneezing , runny nose, cough for about 4 days now. Took theraflu, sinus meds, tylenol, drinking tea which gave slight relief.

## 2023-01-11 ENCOUNTER — Ambulatory Visit
Admission: EM | Admit: 2023-01-11 | Discharge: 2023-01-11 | Disposition: A | Discharge status: Home / Self Care | Payer: No Typology Code available for payment source | Attending: Nurse Practitioner | Admitting: Nurse Practitioner

## 2023-01-11 DIAGNOSIS — J309 Allergic rhinitis, unspecified: Secondary | ICD-10-CM

## 2023-01-11 LAB — POCT RAPID STREP A (OFFICE): Rapid Strep A Screen: NEGATIVE

## 2023-01-11 MED ORDER — CETIRIZINE HCL 10 MG PO TABS: 10.0000 mg | ORAL_TABLET | Freq: Every day | ORAL | 0 refills | Status: AC

## 2023-01-11 MED ORDER — PSEUDOEPH-BROMPHEN-DM 30-2-10 MG/5ML PO SYRP: 5.0000 mL | ORAL_SOLUTION | Freq: Four times a day (QID) | ORAL | 0 refills | Status: AC | PRN

## 2023-01-11 MED ORDER — FLUTICASONE PROPIONATE 50 MCG/ACT NA SUSP: 2.0000 | Freq: Every day | NASAL | 0 refills | Status: AC

## 2023-01-11 NOTE — Discharge Instructions (Addendum)
The rapid strep test was negative. You will be contacted if the culture result is positive. You have declined COVID testing today.  Take medication as directed. Continue your current allergy medication. Increase fluids and get plenty of rest. May take over-the-counter ibuprofen or Tylenol as needed for pain, fever, or general discomfort. Recommend normal saline nasal spray to help with nasal congestion throughout the day. For your cough, it may be helpful to use a humidifier at bedtime during sleep. If your symptoms are not improving over the next 5 to 7 days, please follow-up in this clinic or with your primary care physician for further evaluation.  Follow-up as needed.

## 2023-01-11 NOTE — ED Provider Notes (Signed)
[facility]    CSN: [id number] Arrival date & time: 01/11/23  1134      History   Chief Complaint No chief complaint on file.   HPI Lauren Glover is a 57 y.o. female.   The history is provided by the patient.   The patient presents with a 3-day history of throat pain, headaches, bilateral ear fullness, and cough.  Patient states earlier this morning, she did feel a little clammy.  She denies fever, chills, ear pain, ear drainage, runny nose, wheezing, shortness of breath, difficulty breathing, chest pain, abdominal pain, nausea, vomiting, or diarrhea.  Patient states before her symptoms started, she was out in the rain, causing her hair to get wet.  She states that she took allergy medications and Tylenol with some relief.  Patient declines COVID testing today.  Past Medical History:  Diagnosis Date   Arthritis    Chronic constipation    Diverticulitis of colon    GERD (gastroesophageal reflux disease)    Hiatal hernia    History of kidney stones    Menorrhagia    Uterine fibroid     Patient Active Problem List   Diagnosis Date Noted   Rheumatoid arthritis with rheumatoid factor 09/05/2016   Primary osteoarthritis of both knees 09/05/2016   Primary osteoarthritis of both hands 09/05/2016   Primary osteoarthritis of both feet 09/05/2016   Hand pain 09/04/2016   Gastroesophageal reflux 08/05/2016   History of kidney stones 08/05/2016   Asthma 08/05/2016   Arthralgia 08/02/2016   Vitamin D deficiency 08/02/2016   Fatigue 08/02/2016   Family history of polyps in the colon    Diverticulosis of colon without hemorrhage    Encounter for screening colonoscopy 08/17/2015   Constipation 08/17/2015    Past Surgical History:  Procedure Laterality Date   APPENDECTOMY     COLONOSCOPY N/A 09/11/2015   Procedure: COLONOSCOPY;  Surgeon: Corbin Ade, MD;  [facility]: [facility];  Service: Endoscopy;  Laterality: N/A;   COLONOSCOPY N/A 02/21/2021   Procedure:  COLONOSCOPY;  Surgeon: Corbin Ade, MD;  Location: [facility];  Service: Endoscopy;  Laterality: N/A;  ASA II / PM   ESOPHAGOGASTRODUODENOSCOPY   11/10/2007   Dr. Rourk:Normal esophagus, hiatal hernia.  Small polypoid lesion in the cardia of uncertain significance, status post biopsy removal otherwise normal stomach. Benign fundic gland polyp    EXTRACORPOREAL SHOCK WAVE LITHOTRIPSY Left 04-30-2010   LAPAROSCOPIC APPENDECTOMY  07-04-2009   LAPAROSCOPIC CHOLECYSTECTOMY     POLYPECTOMY  02/21/2021   Procedure: POLYPECTOMY;  Surgeon: Corbin Ade, MD;  Location: [facility];  Service: Endoscopy;;   REMOVAL BENIGN RUMOR LEFT CALF      OB History   No obstetric history on file.      Home Medications    Prior to Admission medications   Medication Sig Start Date End Date Taking? Authorizing Provider  brompheniramine-pseudoephedrine-DM 30-2-10 MG/5ML syrup Take 5 mLs by mouth 4 (four) times daily as needed. 01/11/23  Yes Wilford Merryfield-Warren, Sadie Haber, NP  cetirizine (ZYRTEC) 10 MG tablet Take 1 tablet (10 mg total) by mouth daily. 01/11/23  Yes Dyesha Henault-Warren, Sadie Haber, NP  fluticasone (FLONASE) 50 MCG/ACT nasal spray Place 2 sprays into both nostrils daily. 01/11/23  Yes Mollyann Halbert-Warren, Sadie Haber, NP  amLODipine (NORVASC) 2.5 MG tablet Take 1 tablet (2.5 mg total) by mouth daily. 12/25/21 01/24/22  Carroll Sage, PA-C  Biotin w/ Vitamins C & E (HAIR/SKIN/NAILS PO) Take 3 capsules by mouth  daily.    [provider]  Cholecalciferol (VITAMIN D3) 125 MCG (5000 UT) CAPS Take 5,000 Units by mouth daily.    [provider]  ferrous sulfate 325 (65 FE) MG tablet Take 325 mg by mouth daily with breakfast.    [provider]  predniSONE (DELTASONE) 20 MG tablet Take 2 tablets (40 mg total) by mouth daily with breakfast. 09/04/22   Particia Nearing, PA-C  promethazine-dextromethorphan (PROMETHAZINE-DM) 6.25-15 MG/5ML syrup Take 5 mLs by mouth 4 (four) times daily as  needed. 09/04/22   Particia Nearing, PA-C  Tetrahydrozoline HCl (REDNESS RELIEVER EYE DROPS OP) Place 1 drop into both eyes daily as needed (redness).    [provider]    Family History Family History  Problem Relation Age of Onset   Colon polyps Sister    High blood pressure Sister    High blood pressure Mother    High blood pressure Brother    Lupus Sister    Colon cancer Neg Hx     Social History Social History   Tobacco Use   Smoking status: Never   Smokeless tobacco: Never  Vaping Use   Vaping Use: Never used  Substance Use Topics   Alcohol use: No    Alcohol/week: 0.0 standard drinks of alcohol   Drug use: No     Allergies   Patient has no known allergies.   Review of Systems Review of Systems Per HPI  Physical Exam Triage Vital Signs ED Triage Vitals  Enc Vitals Group     BP 01/11/23 1137 130/84     Pulse Rate 01/11/23 1137 97     Resp 01/11/23 1137 20     Temp 01/11/23 1137 98.6 F (37 C)     Temp Source 01/11/23 1137 Oral     SpO2 01/11/23 1137 95 %     Weight --      Height --      Head Circumference --      Peak Flow --      Pain Score 01/11/23 1139 4     Pain Loc --      Pain Edu? --      Excl. in GC? --    No data found.  Updated Vital Signs BP 130/84 (BP Location: Right Arm)   Pulse 97   Temp 98.6 F (37 C) (Oral)   Resp 20   LMP 12/31/2022   SpO2 95%   Visual Acuity Right Eye Distance:   Left Eye Distance:   Bilateral Distance:    Right Eye Near:   Left Eye Near:    Bilateral Near:     Physical Exam Vitals and nursing note reviewed.  Constitutional:      Appearance: Normal appearance. She is well-developed.  HENT:     Head: Normocephalic and atraumatic.     Right Ear: Tympanic membrane, ear canal and external ear normal.     Left Ear: Tympanic membrane, ear canal and external ear normal.     Nose: Congestion present. No rhinorrhea.     Right Turbinates: Enlarged and swollen.     Left Turbinates:  Enlarged and swollen.     Right Sinus: No maxillary sinus tenderness or frontal sinus tenderness.     Left Sinus: No maxillary sinus tenderness or frontal sinus tenderness.     Mouth/Throat:     Lips: Pink.     Pharynx: Oropharynx is clear. Uvula midline. Posterior oropharyngeal erythema present. No pharyngeal swelling or uvula  swelling.     Comments: Cobblestoning present on posterior oropharynx Eyes:     Conjunctiva/sclera: Conjunctivae normal.     Pupils: Pupils are equal, round, and reactive to light.  Neck:     Thyroid: No thyromegaly.     Trachea: No tracheal deviation.  Cardiovascular:     Rate and Rhythm: Normal rate and regular rhythm.     Pulses: Normal pulses.     Heart sounds: Normal heart sounds.  Pulmonary:     Effort: Pulmonary effort is normal.     Breath sounds: Normal breath sounds.  Abdominal:     General: Bowel sounds are normal. There is no distension.     Palpations: Abdomen is soft.     Tenderness: There is no abdominal tenderness.  Musculoskeletal:     Cervical back: Normal range of motion and neck supple.  Skin:    General: Skin is warm and dry.  Neurological:     General: No focal deficit present.     Mental Status: She is alert and oriented to person, place, and time.  Psychiatric:        Mood and Affect: Mood normal.        Behavior: Behavior normal.        Thought Content: Thought content normal.        Judgment: Judgment normal.      UC Treatments / Results  Labs (all labs ordered are listed, but only abnormal results are displayed) Labs Reviewed  CULTURE, GROUP A STREP [facility])  POCT RAPID STREP A (OFFICE)    EKG   Radiology No results found.  Procedures Procedures (including critical care time)  Medications Ordered in UC Medications - No data to display  Initial Impression / Assessment and Plan / UC Course  I have reviewed the triage vital signs and the nursing notes.  Pertinent labs & imaging results that were available  during my care of the patient were reviewed by me and considered in my medical decision making (see chart for details).  The patient is well-appearing, she is in no acute distress, vital signs are stable.  Rapid strep test was negative, throat culture is pending.  Patient declines COVID testing today.  Suspect allergic rhinitis given the patient's symptoms.  She has not been febrile, and has no maxillary or frontal sinus tenderness.  Will treat patient with Bromfed-DM for the cough and to act as an antihistamine, cetirizine 10 mg daily for her nasal congestion, and fluticasone 50 mcg nasal spray to help with her nasal congestion and bilateral ear fullness and pressure.  Supportive care recommendations were provided and discussed with the patient to include over-the-counter analgesics for pain or discomfort, increasing fluids, allowing for plenty of rest, and use of a humidifier at bedtime.  Discussed indications with the patient of when follow-up may be necessary.  Patient verbalizes understanding and is in agreement with this plan of care all questions were answered.  Patient stable for discharge.  Final Clinical Impressions(s) / UC Diagnoses   Final diagnoses:  Allergic rhinitis, unspecified seasonality, unspecified trigger     Discharge Instructions      The rapid strep test was negative. You will be contacted if the culture result is positive. You have declined COVID testing today.  Take medication as directed. Continue your current allergy medication. Increase fluids and get plenty of rest. May take over-the-counter ibuprofen or Tylenol as needed for pain, fever, or general discomfort. Recommend normal saline nasal spray to help with nasal  congestion throughout the day. For your cough, it may be helpful to use a humidifier at bedtime during sleep. If your symptoms are not improving over the next 5 to 7 days, please follow-up in this clinic or with your primary care physician for further  evaluation.  Follow-up as needed.     ED Prescriptions     Medication Sig Dispense Auth. Provider   brompheniramine-pseudoephedrine-DM 30-2-10 MG/5ML syrup Take 5 mLs by mouth 4 (four) times daily as needed. 140 mL Braelin Costlow-Warren, Sadie Haber, NP   cetirizine (ZYRTEC) 10 MG tablet Take 1 tablet (10 mg total) by mouth daily. 30 tablet Rilynn Habel-Warren, Sadie Haber, NP   fluticasone (FLONASE) 50 MCG/ACT nasal spray Place 2 sprays into both nostrils daily. 16 g Serenitie Vinton-Warren, Sadie Haber, NP      PDMP not reviewed this encounter.   Abran Cantor, NP 01/11/23 1213

## 2023-01-11 NOTE — ED Triage Notes (Signed)
Pt reports x 3 days she has been having throat pain, headaches, bilateral ear fullness, slight cough, clammy feeling. Took allergy pills and tylenol which gave slight relief.

## 2023-01-14 LAB — CULTURE, GROUP A STREP (THRC)

## 2023-12-20 ENCOUNTER — Other Ambulatory Visit: Payer: Self-pay

## 2023-12-20 ENCOUNTER — Emergency Department (HOSPITAL_COMMUNITY)

## 2023-12-20 ENCOUNTER — Encounter (HOSPITAL_COMMUNITY): Payer: Self-pay | Admitting: *Deleted

## 2023-12-20 ENCOUNTER — Emergency Department (HOSPITAL_COMMUNITY)
Admission: EM | Admit: 2023-12-20 | Discharge: 2023-12-20 | Disposition: A | Discharge status: Home / Self Care | Attending: Student | Admitting: Student

## 2023-12-20 DIAGNOSIS — J45909 Unspecified asthma, uncomplicated: Secondary | ICD-10-CM | POA: Insufficient documentation

## 2023-12-20 DIAGNOSIS — R109 Unspecified abdominal pain: Secondary | ICD-10-CM

## 2023-12-20 DIAGNOSIS — R1032 Left lower quadrant pain: Secondary | ICD-10-CM | POA: Diagnosis present

## 2023-12-20 LAB — CBC WITH DIFFERENTIAL/PLATELET
Abs Immature Granulocytes: 0.01 10*3/uL (ref 0.00–0.07)
Basophils Absolute: 0 10*3/uL (ref 0.0–0.1)
Basophils Relative: 1 %
Eosinophils Absolute: 0.2 10*3/uL (ref 0.0–0.5)
Eosinophils Relative: 4 %
HCT: 41.8 % (ref 36.0–46.0)
Hemoglobin: 13.7 g/dL (ref 12.0–15.0)
Immature Granulocytes: 0 %
Lymphocytes Relative: 52 %
Lymphs Abs: 2.8 10*3/uL (ref 0.7–4.0)
MCH: 32.9 pg (ref 26.0–34.0)
MCHC: 32.8 g/dL (ref 30.0–36.0)
MCV: 100.2 fL — ABNORMAL HIGH (ref 80.0–100.0)
Monocytes Absolute: 0.6 10*3/uL (ref 0.1–1.0)
Monocytes Relative: 11 %
Neutro Abs: 1.7 10*3/uL (ref 1.7–7.7)
Neutrophils Relative %: 32 %
Platelets: 204 10*3/uL (ref 150–400)
RBC: 4.17 MIL/uL (ref 3.87–5.11)
RDW: 14 % (ref 11.5–15.5)
WBC: 5.3 10*3/uL (ref 4.0–10.5)
nRBC: 0 % (ref 0.0–0.2)

## 2023-12-20 LAB — URINALYSIS, ROUTINE W REFLEX MICROSCOPIC
Bacteria, UA: NONE SEEN
Bilirubin Urine: NEGATIVE
Glucose, UA: NEGATIVE mg/dL
Hgb urine dipstick: NEGATIVE
Ketones, ur: NEGATIVE mg/dL
Leukocytes,Ua: NEGATIVE
Nitrite: NEGATIVE
Protein, ur: 30 mg/dL — AB
Specific Gravity, Urine: 1.019 (ref 1.005–1.030)
pH: 7 (ref 5.0–8.0)

## 2023-12-20 LAB — COMPREHENSIVE METABOLIC PANEL
ALT: 23 U/L (ref 0–44)
AST: 26 U/L (ref 15–41)
Albumin: 4 g/dL (ref 3.5–5.0)
Alkaline Phosphatase: 94 U/L (ref 38–126)
Anion gap: 11 (ref 5–15)
BUN: 13 mg/dL (ref 6–20)
CO2: 27 mmol/L (ref 22–32)
Calcium: 9.7 mg/dL (ref 8.9–10.3)
Chloride: 103 mmol/L (ref 98–111)
Creatinine, Ser: 0.78 mg/dL (ref 0.44–1.00)
GFR, Estimated: >60 mL/min (ref >60)
Glucose, Bld: 112 mg/dL — ABNORMAL HIGH (ref 70–99)
Potassium: 4.4 mmol/L (ref 3.5–5.1)
Sodium: 141 mmol/L (ref 135–145)
Total Bilirubin: 0.6 mg/dL (ref 0.0–1.2)
Total Protein: 7.2 g/dL (ref 6.5–8.1)

## 2023-12-20 LAB — LIPASE, BLOOD: Lipase: 43 U/L (ref 11–51)

## 2023-12-20 MED ORDER — TAMSULOSIN HCL 0.4 MG PO CAPS
0.4000 mg | ORAL_CAPSULE | Freq: Every day | ORAL | 0 refills | Status: AC
Start: 2023-12-20 — End: 2023-12-27

## 2023-12-20 MED ORDER — NAPROXEN 375 MG PO TABS: 375.0000 mg | ORAL_TABLET | Freq: Two times a day (BID) | ORAL | 0 refills | Status: DC

## 2023-12-20 NOTE — ED Triage Notes (Signed)
 Here by POV from home for L flank and L sided abd pain. Unable to describe, but endorses tense, hurting, bloated and gas build up. Onset 2 nights ago. Describes as intermittent, and comes and goes, fluctuates. Also reports nausea, hot and sweaty.  Denies VD, urinary or vaginal sx, or fever.

## 2023-12-20 NOTE — ED Provider Notes (Signed)
 [facility] AT [facility] Provider Note  CSN: [id number] Arrival date & time: 12/20/23 1705  Chief Complaint(s) Abdominal Pain  HPI Lauren Glover is a 58 y.o. female with PMH GERD, nephrolithiasis, uterine fibroids who presents emergency room for evaluation of flank pain.  States that 2 nights ago she had intermittent left flank pain that resolved.  Pain then returned this morning was severe and radiated down to the groin.  Pain has resolved by the time of my ER evaluation.  Denies associated nausea, vomiting, chest pain, shortness of breath or other systemic symptoms.   Past Medical History Past Medical History:  Diagnosis Date   Arthritis    Chronic constipation    Diverticulitis of colon    GERD (gastroesophageal reflux disease)    Hiatal hernia    History of kidney stones    Menorrhagia    Uterine fibroid    Patient Active Problem List   Diagnosis Date Noted   Rheumatoid arthritis with rheumatoid factor 09/05/2016   Primary osteoarthritis of both knees 09/05/2016   Primary osteoarthritis of both hands 09/05/2016   Primary osteoarthritis of both feet 09/05/2016   Hand pain 09/04/2016   Gastroesophageal reflux 08/05/2016   History of kidney stones 08/05/2016   Asthma 08/05/2016   Arthralgia 08/02/2016   Vitamin D deficiency 08/02/2016   Fatigue 08/02/2016   Family history of polyps in the colon    Diverticulosis of colon without hemorrhage    Encounter for screening colonoscopy 08/17/2015   Constipation 08/17/2015   Home Medication(s) Prior to Admission medications   Medication Sig Start Date End Date Taking? Authorizing Provider  naproxen (NAPROSYN) 375 MG tablet Take 1 tablet (375 mg total) by mouth 2 (two) times daily. 12/20/23  Yes Isidor Bromell, MD  tamsulosin (FLOMAX) 0.4 MG CAPS capsule Take 1 capsule (0.4 mg total) by mouth daily for 7 days. 12/20/23 12/27/23 Yes Rosine Solecki, MD  amLODipine (NORVASC) 2.5 MG tablet Take 1  tablet (2.5 mg total) by mouth daily. 12/25/21 01/24/22  Carroll Sage, PA-C  Biotin w/ Vitamins C & E (HAIR/SKIN/NAILS PO) Take 3 capsules by mouth daily.    [provider]  brompheniramine-pseudoephedrine-DM 30-2-10 MG/5ML syrup Take 5 mLs by mouth 4 (four) times daily as needed. 01/11/23   Leath-Warren, Sadie Haber, NP  cetirizine (ZYRTEC) 10 MG tablet Take 1 tablet (10 mg total) by mouth daily. 01/11/23   Leath-Warren, Sadie Haber, NP  Cholecalciferol (VITAMIN D3) 125 MCG (5000 UT) CAPS Take 5,000 Units by mouth daily.    [provider]  ferrous sulfate 325 (65 FE) MG tablet Take 325 mg by mouth daily with breakfast.    [provider]  fluticasone (FLONASE) 50 MCG/ACT nasal spray Place 2 sprays into both nostrils daily. 01/11/23   Leath-Warren, Sadie Haber, NP  predniSONE (DELTASONE) 20 MG tablet Take 2 tablets (40 mg total) by mouth daily with breakfast. 09/04/22   Particia Nearing, PA-C  promethazine-dextromethorphan (PROMETHAZINE-DM) 6.25-15 MG/5ML syrup Take 5 mLs by mouth 4 (four) times daily as needed. 09/04/22   Particia Nearing, PA-C  Tetrahydrozoline HCl (REDNESS RELIEVER EYE DROPS OP) Place 1 drop into both eyes daily as needed (redness).    [provider]  Past Surgical History Past Surgical History:  Procedure Laterality Date   APPENDECTOMY     COLONOSCOPY N/A 09/11/2015   Procedure: COLONOSCOPY;  Surgeon: Corbin Ade, MD;  [facility]: [facility];  Service: Endoscopy;  Laterality: N/A;   COLONOSCOPY N/A 02/21/2021   Procedure: COLONOSCOPY;  Surgeon: Corbin Ade, MD;  [facility]: [facility];  Service: Endoscopy;  Laterality: N/A;  ASA II / PM   ESOPHAGOGASTRODUODENOSCOPY   11/10/2007   Dr. Rourk:Normal esophagus, hiatal hernia.  Small polypoid lesion in the cardia of uncertain significance, status  post biopsy removal otherwise normal stomach. Benign fundic gland polyp    EXTRACORPOREAL SHOCK WAVE LITHOTRIPSY Left 04-30-2010   LAPAROSCOPIC APPENDECTOMY  07-04-2009   LAPAROSCOPIC CHOLECYSTECTOMY     POLYPECTOMY  02/21/2021   Procedure: POLYPECTOMY;  Surgeon: Corbin Ade, MD;  [facility];  Service: Endoscopy;;   REMOVAL BENIGN RUMOR LEFT CALF     Family History Family History  Problem Relation Age of Onset   Colon polyps Sister    High blood pressure Sister    High blood pressure Mother    High blood pressure Brother    Lupus Sister    Colon cancer Neg Hx     Social History Social History   Tobacco Use   Smoking status: Never   Smokeless tobacco: Never  Vaping Use   Vaping status: Never Used  Substance Use Topics   Alcohol use: No    Alcohol/week: 0.0 standard drinks of alcohol   Drug use: No   Allergies Patient has no known allergies.  Review of Systems Review of Systems  Genitourinary:  Positive for flank pain.    Physical Exam Vital Signs  I have reviewed the triage vital signs BP 128/69   Pulse 69   Temp 98.7 F (37.1 C)   Resp 18   Wt 83.9 kg   SpO2 98%   BMI 34.67 kg/m   Physical Exam Vitals and nursing note reviewed.  Constitutional:      General: She is not in acute distress.    Appearance: She is well-developed.  HENT:     Head: Normocephalic and atraumatic.  Eyes:     Conjunctiva/sclera: Conjunctivae normal.  Pulmonary:     Effort: Pulmonary effort is normal. No respiratory distress.  Abdominal:     Tenderness: There is left CVA tenderness.  Musculoskeletal:        General: No swelling.     Cervical back: Neck supple.  Skin:    General: Skin is warm and dry.     Capillary Refill: Capillary refill takes less than 2 seconds.  Neurological:     Mental Status: She is alert.  Psychiatric:        Mood and Affect: Mood normal.     ED Results and Treatments Labs (all labs ordered are listed, but only abnormal  results are displayed) Labs Reviewed  CBC WITH DIFFERENTIAL/PLATELET - Abnormal; Notable for the following components:      Result Value   MCV 100.2 (*)    All other components within normal limits  COMPREHENSIVE METABOLIC PANEL - Abnormal; Notable for the following components:   Glucose, Bld 112 (*)    All other components within normal limits  URINALYSIS, ROUTINE W REFLEX MICROSCOPIC - Abnormal; Notable for the following components:   Protein, ur 30 (*)    All other components within normal limits  LIPASE, BLOOD  Radiology CT Renal Stone Study Result Date: 12/20/2023 CLINICAL DATA:  Left-sided flank pain for 2 days, initial encounter EXAM: CT ABDOMEN AND PELVIS WITHOUT CONTRAST TECHNIQUE: Multidetector CT imaging of the abdomen and pelvis was performed following the standard protocol without IV contrast. RADIATION DOSE REDUCTION: This exam was performed according to the departmental dose-optimization program which includes automated exposure control, adjustment of the mA and/or kV according to patient size and/or use of iterative reconstruction technique. COMPARISON:  07/25/2009 FINDINGS: Lower chest: Lung bases are free of acute infiltrate or sizable effusion. 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 Hepatobiliary: No focal liver abnormality is seen. Status post cholecystectomy. No biliary dilatation. Pancreas: Unremarkable. No pancreatic ductal dilatation or surrounding inflammatory changes. Spleen: Normal in size without focal abnormality. Adrenals/Urinary Tract: Adrenal glands are within normal limits. Kidneys are well visualized bilaterally. 3 mm nonobstructing stone is noted in the lower pole of the left kidney new from the prior exam. Mild fullness of the left collecting system is noted without definitive ureteral calculus. The bladder is  decompressed. Stomach/Bowel: Appendix has been surgically removed. Mild diverticular change of the colon is noted without diverticulitis. Stomach and small bowel are within normal limits. Vascular/Lymphatic: No significant vascular findings are present. No enlarged abdominal or pelvic lymph nodes. Reproductive: Uterus and bilateral adnexa are unremarkable. Other: No abdominal wall hernia or abnormality. No abdominopelvic ascites. Musculoskeletal: No acute or significant osseous findings. IMPRESSION: 3 mm lower pole left renal stone without obstructive change. Some mild fullness of the collecting system on the left is noted and it is possible this lower pole stone causes intermittent obstructive change. Mild diverticulosis without diverticulitis. Electronically Signed   By: Alcide Clever M.D.   On: 12/20/2023 19:35    Pertinent labs & imaging results that were available during my care of the patient were reviewed by me and considered in my medical decision making (see MDM for details).  Medications Ordered in ED Medications - No data to display                                                                                                                                   Procedures Procedures  (including critical care time)  Medical Decision Making / ED Course   This patient presents to the ED for concern of flank pain, this involves an extensive number of treatment options, and is a complaint that carries with it a high risk of complications and morbidity.  The differential diagnosis includes nephrolithiasis, pyelonephritis, obstruction, AAA, musculoskeletal strain, vertebral fracture, intra-abdominal abscess, diverticulitis  MDM: Patient seen in the emergency room for evaluation of flank pain.  Physical exam with left CVA tenderness that is mild but is otherwise unremarkable.  Urinalysis with 6-10 red blood cells but is otherwise unremarkable.  Laboratory evaluation unremarkable.  CT stone study  obtained that shows a 3 mm lower pole left renal stone with some mild fullness  of the collection system that may be causing intermittent obstructive change.  This does fit with the patient's intermittent symptoms and she was encouraged to hydrate aggressively.  Given as needed medications including Flomax and Naprosyn but at this time she does not meet inpatient criteria for admission.  Will be discharged with outpatient follow-up and return precautions of which she voiced understanding.   Additional history obtained:  -External records from outside source obtained and reviewed including: Chart review including previous notes, labs, imaging, consultation notes   Lab Tests: -I ordered, reviewed, and interpreted labs.   The pertinent results include:   Labs Reviewed  CBC WITH DIFFERENTIAL/PLATELET - Abnormal; Notable for the following components:      Result Value   MCV 100.2 (*)    All other components within normal limits  COMPREHENSIVE METABOLIC PANEL - Abnormal; Notable for the following components:   Glucose, Bld 112 (*)    All other components within normal limits  URINALYSIS, ROUTINE W REFLEX MICROSCOPIC - Abnormal; Notable for the following components:   Protein, ur 30 (*)    All other components within normal limits  LIPASE, BLOOD        Imaging Studies ordered: I ordered imaging studies including CT stone study I independently visualized and interpreted imaging. I agree with the radiologist interpretation   Medicines ordered and prescription drug management: Meds ordered this encounter  Medications   naproxen (NAPROSYN) 375 MG tablet    Sig: Take 1 tablet (375 mg total) by mouth 2 (two) times daily.    Dispense:  20 tablet    Refill:  0   tamsulosin (FLOMAX) 0.4 MG CAPS capsule    Sig: Take 1 capsule (0.4 mg total) by mouth daily for 7 days.    Dispense:  7 capsule    Refill:  0    -I have reviewed the patients home medicines and have made adjustments as  needed  Critical interventions none  Social Determinants of Health:  Factors impacting patients care include: none   Reevaluation: After the interventions noted above, I reevaluated the patient and found that they have :stayed the same  Co morbidities that complicate the patient evaluation  Past Medical History:  Diagnosis Date   Arthritis    Chronic constipation    Diverticulitis of colon    GERD (gastroesophageal reflux disease)    Hiatal hernia    History of kidney stones    Menorrhagia    Uterine fibroid       Dispostion: I considered admission for this patient, but at this time she does not meet inpatient criteria for admission and will be discharged with outpatient follow-up.     Final Clinical Impression(s) / ED Diagnoses Final diagnoses:  Flank pain     @PCDICTATION @    Glendora Score, MD 12/20/23 2020

## 2024-01-22 ENCOUNTER — Other Ambulatory Visit (HOSPITAL_COMMUNITY): Payer: Self-pay | Admitting: Internal Medicine

## 2024-01-22 DIAGNOSIS — R42 Dizziness and giddiness: Secondary | ICD-10-CM

## 2024-02-02 ENCOUNTER — Ambulatory Visit (HOSPITAL_BASED_OUTPATIENT_CLINIC_OR_DEPARTMENT_OTHER)
Admission: RE | Admit: 2024-02-02 | Discharge: 2024-02-02 | Disposition: A | Discharge status: Home / Self Care | Source: Ambulatory Visit | Attending: Internal Medicine | Admitting: Internal Medicine

## 2024-02-02 ENCOUNTER — Encounter (HOSPITAL_BASED_OUTPATIENT_CLINIC_OR_DEPARTMENT_OTHER): Payer: Self-pay

## 2024-02-02 DIAGNOSIS — R42 Dizziness and giddiness: Secondary | ICD-10-CM | POA: Insufficient documentation

## 2024-02-02 MED ORDER — IOHEXOL 300 MG/ML  SOLN
100.0000 mL | Freq: Once | INTRAMUSCULAR | Status: AC | PRN
  Administered 2024-02-02: 75 mL via INTRAVENOUS

## 2024-08-07 ENCOUNTER — Ambulatory Visit
Admission: EM | Admit: 2024-08-07 | Discharge: 2024-08-07 | Disposition: A | Discharge status: Home / Self Care | Attending: Nurse Practitioner | Admitting: Nurse Practitioner

## 2024-08-07 DIAGNOSIS — M5441 Lumbago with sciatica, right side: Secondary | ICD-10-CM | POA: Diagnosis not present

## 2024-08-07 DIAGNOSIS — Z8669 Personal history of other diseases of the nervous system and sense organs: Secondary | ICD-10-CM | POA: Diagnosis not present

## 2024-08-07 MED ORDER — KETOROLAC TROMETHAMINE 30 MG/ML IJ SOLN
30.0000 mg | Freq: Once | INTRAMUSCULAR | Status: AC
  Administered 2024-08-07: 30 mg via INTRAMUSCULAR

## 2024-08-07 MED ORDER — TIZANIDINE HCL 4 MG PO TABS: 4.0000 mg | ORAL_TABLET | Freq: Three times a day (TID) | ORAL | 0 refills | Status: DC | PRN

## 2024-08-07 MED ORDER — PREDNISONE 20 MG PO TABS: 40.0000 mg | ORAL_TABLET | Freq: Every day | ORAL | 0 refills | Status: AC

## 2024-08-07 MED ORDER — DEXAMETHASONE SOD PHOSPHATE PF 10 MG/ML IJ SOLN
10.0000 mg | Freq: Once | INTRAMUSCULAR | Status: AC
  Administered 2024-08-07: 10 mg via INTRAMUSCULAR

## 2024-08-07 NOTE — ED Provider Notes (Signed)
 [facility]    CSN: [id number] Arrival date & time: 08/07/24  1325      History   Chief Complaint No chief complaint on file.   HPI Lauren Glover is a 58 y.o. female.   The history is provided by the patient.   Patient presents for complaints of right sided low back pain.  Patient reports prior history of sciatica.  She states that the pain starts in her right lower back and radiates down into the mid right thigh.  She denies numbness, tingling, loss of bowel or bladder function, saddle anesthesia, lower extremity weakness, or urinary symptoms.  Patient states that she has been using a rub that she got from [employer].  Also states that she has been taking Tylenol  for her symptoms.  Past Medical History:  Diagnosis Date   Arthritis    Chronic constipation    Diverticulitis of colon    GERD (gastroesophageal reflux disease)    Hiatal hernia    History of kidney stones    Menorrhagia    Uterine fibroid     Patient Active Problem List   Diagnosis Date Noted   Rheumatoid arthritis with rheumatoid factor 09/05/2016   Primary osteoarthritis of both knees 09/05/2016   Primary osteoarthritis of both hands 09/05/2016   Primary osteoarthritis of both feet 09/05/2016   Hand pain 09/04/2016   Gastroesophageal reflux 08/05/2016   History of kidney stones 08/05/2016   Asthma 08/05/2016   Arthralgia 08/02/2016   Vitamin D deficiency 08/02/2016   Fatigue 08/02/2016   Family history of polyps in the colon    Diverticulosis of colon without hemorrhage    Encounter for screening colonoscopy 08/17/2015   Constipation 08/17/2015    Past Surgical History:  Procedure Laterality Date   APPENDECTOMY     COLONOSCOPY N/A 09/11/2015   Procedure: COLONOSCOPY;  Surgeon: Lamar CHRISTELLA Hollingshead, MD;  Location: [facility];  Service: Endoscopy;  Laterality: N/A;   COLONOSCOPY N/A 02/21/2021   Procedure: COLONOSCOPY;  Surgeon: Hollingshead Lamar CHRISTELLA, MD;  Location: [facility];  Service:  Endoscopy;  Laterality: N/A;  ASA II / PM   ESOPHAGOGASTRODUODENOSCOPY   11/10/2007   Dr. Rourk:Normal esophagus, hiatal hernia.  Small polypoid lesion in the cardia of uncertain significance, status post biopsy removal otherwise normal stomach. Benign fundic gland polyp    EXTRACORPOREAL SHOCK WAVE LITHOTRIPSY Left 04-30-2010   LAPAROSCOPIC APPENDECTOMY  07-04-2009   LAPAROSCOPIC CHOLECYSTECTOMY     POLYPECTOMY  02/21/2021   Procedure: POLYPECTOMY;  Surgeon: Hollingshead Lamar CHRISTELLA, MD;  Location: [facility];  Service: Endoscopy;;   REMOVAL BENIGN RUMOR LEFT CALF      OB History   No obstetric history on file.      Home Medications    Prior to Admission medications   Medication Sig Start Date End Date Taking? Authorizing Provider  predniSONE  (DELTASONE ) 20 MG tablet Take 2 tablets (40 mg total) by mouth daily with breakfast for 5 days. 08/07/24 08/12/24 Yes Leath-Warren, Etta PARAS, NP  tiZANidine (ZANAFLEX) 4 MG tablet Take 1 tablet (4 mg total) by mouth every 8 (eight) hours as needed. 08/07/24  Yes Leath-Warren, Etta PARAS, NP  amLODipine  (NORVASC ) 2.5 MG tablet Take 1 tablet (2.5 mg total) by mouth daily. 12/25/21 01/24/22  Waylan Elsie PARAS, PA-C  Biotin w/ Vitamins C & E (HAIR/SKIN/NAILS PO) Take 3 capsules by mouth daily.    [provider]  brompheniramine-pseudoephedrine-DM 30-2-10 MG/5ML syrup Take 5 mLs by mouth 4 (four)  times daily as needed. 01/11/23   Leath-Warren, Etta PARAS, NP  cetirizine  (ZYRTEC ) 10 MG tablet Take 1 tablet (10 mg total) by mouth daily. 01/11/23   Leath-Warren, Etta PARAS, NP  Cholecalciferol (VITAMIN D3) 125 MCG (5000 UT) CAPS Take 5,000 Units by mouth daily.    [provider]  ferrous sulfate 325 (65 FE) MG tablet Take 325 mg by mouth daily with breakfast.    [provider]  fluticasone  (FLONASE ) 50 MCG/ACT nasal spray Place 2 sprays into both nostrils daily. 01/11/23   Leath-Warren, Etta PARAS, NP  naproxen  (NAPROSYN ) 375 MG tablet  Take 1 tablet (375 mg total) by mouth 2 (two) times daily. 12/20/23   Kommor, Madison, MD  promethazine -dextromethorphan  (PROMETHAZINE -DM) 6.25-15 MG/5ML syrup Take 5 mLs by mouth 4 (four) times daily as needed. 09/04/22   Stuart Vernell Norris, PA-C  Tetrahydrozoline HCl (REDNESS RELIEVER EYE DROPS OP) Place 1 drop into both eyes daily as needed (redness).    [provider]    Family History Family History  Problem Relation Age of Onset   Colon polyps Sister    High blood pressure Sister    High blood pressure Mother    High blood pressure Brother    Lupus Sister    Colon cancer Neg Hx     Social History Social History   Tobacco Use   Smoking status: Never   Smokeless tobacco: Never  Vaping Use   Vaping status: Never Used  Substance Use Topics   Alcohol use: No    Alcohol/week: 0.0 standard drinks of alcohol   Drug use: No     Allergies   Patient has no known allergies.   Review of Systems Review of Systems Per HPI  Physical Exam Triage Vital Signs ED Triage Vitals  Encounter Vitals Group     BP 08/07/24 1348 134/84     Girls Systolic BP Percentile --      Girls Diastolic BP Percentile --      Boys Systolic BP Percentile --      Boys Diastolic BP Percentile --      Pulse Rate 08/07/24 1348 74     Resp 08/07/24 1348 18     Temp 08/07/24 1348 98.4 F (36.9 C)     Temp Source 08/07/24 1348 Oral     SpO2 08/07/24 1348 95 %     Weight --      Height --      Head Circumference --      Peak Flow --      Pain Score 08/07/24 1349 6     Pain Loc --      Pain Education --      Exclude from Growth Chart --    No data found.  Updated Vital Signs BP 134/84 (BP Location: Right Arm)   Pulse 74   Temp 98.4 F (36.9 C) (Oral)   Resp 18   LMP  (LMP Unknown)   SpO2 95%   Visual Acuity Right Eye Distance:   Left Eye Distance:   Bilateral Distance:    Right Eye Near:   Left Eye Near:    Bilateral Near:     Physical Exam Vitals and nursing note  reviewed.  Constitutional:      General: She is not in acute distress.    Appearance: Normal appearance.  HENT:     Head: Normocephalic.  Eyes:     Extraocular Movements: Extraocular movements intact.     Conjunctiva/sclera: Conjunctivae normal.  Pupils: Pupils are equal, round, and reactive to light.  Cardiovascular:     Rate and Rhythm: Normal rate and regular rhythm.     Pulses: Normal pulses.     Heart sounds: Normal heart sounds.  Pulmonary:     Effort: Pulmonary effort is normal. No respiratory distress.     Breath sounds: Normal breath sounds. No stridor. No wheezing, rhonchi or rales.  Abdominal:     General: Bowel sounds are normal.     Palpations: Abdomen is soft.  Musculoskeletal:     Cervical back: Normal range of motion.     Lumbar back: Tenderness present. No swelling or deformity. Decreased range of motion. Negative right straight leg raise test and negative left straight leg raise test.       Back:  Skin:    General: Skin is warm and dry.  Neurological:     General: No focal deficit present.     Mental Status: She is alert and oriented to person, place, and time.  Psychiatric:        Mood and Affect: Mood normal.        Behavior: Behavior normal.      UC Treatments / Results  Labs (all labs ordered are listed, but only abnormal results are displayed) Labs Reviewed - No data to display  EKG   Radiology No results found.  Procedures Procedures (including critical care time)  Medications Ordered in UC Medications  ketorolac (TORADOL) 30 MG/ML injection 30 mg (has no administration in time range)  dexamethasone  (DECADRON ) injection 10 mg (has no administration in time range)    Initial Impression / Assessment and Plan / UC Course  I have reviewed the triage vital signs and the nursing notes.  Pertinent labs & imaging results that were available during my care of the patient were reviewed by me and considered in my medical decision making (see  chart for details).  Symptoms are consistent with right sided low back pain with sciatica.  On exam, patient does have tenderness to the lumbar spine on the right side that extends down the right sciatic nerve.  No red flag symptoms noted on exam.  Decadron  10 mg IM and Toradol 30 mg IM administered for pain (patient denies history of diabetes, reviewed most recent lab work dated 12/20/2023, creatinine 0.78, GFR greater than 60).  Will start patient on prednisone  40 mg for the next 5 days along with tizanidine 4 mg.  Supportive care recommendations were provided discussed with the patient to include the use of ice or heat, stretching exercises, and remaining active.  Patient was given strict ER follow-up precautions, along with indications to follow-up with her PCP.  Patient was in agreement with this plan of care and verbalizes understanding.  All questions were answered.  Patient stable for discharge.  Final Clinical Impressions(s) / UC Diagnoses   Final diagnoses:  Right-sided low back pain with right-sided sciatica, unspecified chronicity  History of sciatica     Discharge Instructions      You were given injections of Toradol 30 mg and Decadron  10 mg.  Do not take any additional NSAIDs today to include ibuprofen , Aleve , Advil , Motrin , or naproxen .  You may take Tylenol  for breakthrough pain. Take medication as prescribed.  You will start the prednisone  tomorrow.  Do not take any additional NSAIDs while you are taking the prednisone  to include ibuprofen , Aleve , Advil , Motrin , or naproxen .  You may take Tylenol  for breakthrough pain. Recommend the use of ice or  heat.  Apply ice for pain or swelling, heat for spasm or stiffness.  Apply for 20 minutes, remove for 1 hour, repeat as needed. Gentle stretching and range of motion exercises to help decrease your recovery time. Go to the emergency department if you experience loss of control of your bowel or bladder, worsening back pain, you become  unable to walk, or other concerns. If your symptoms fail to improve, please follow-up with your primary care physician for further evaluation. Follow-up as needed.     ED Prescriptions     Medication Sig Dispense Auth. Provider   predniSONE  (DELTASONE ) 20 MG tablet Take 2 tablets (40 mg total) by mouth daily with breakfast for 5 days. 10 tablet Leath-Warren, Etta PARAS, NP   tiZANidine (ZANAFLEX) 4 MG tablet Take 1 tablet (4 mg total) by mouth every 8 (eight) hours as needed. 20 tablet Leath-Warren, Etta PARAS, NP      PDMP not reviewed this encounter.   Gilmer Etta PARAS, NP 08/07/24 1408

## 2024-08-07 NOTE — ED Triage Notes (Addendum)
 Pt reports she has right side low back pain that goes down to her foot x 3 weeks  Took tylenol  and applied topical gel

## 2024-08-07 NOTE — Discharge Instructions (Addendum)
 You were given injections of Toradol 30 mg and Decadron  10 mg.  Do not take any additional NSAIDs today to include ibuprofen , Aleve , Advil , Motrin , or naproxen .  You may take Tylenol  for breakthrough pain. Take medication as prescribed.  You will start the prednisone  tomorrow.  Do not take any additional NSAIDs while you are taking the prednisone  to include ibuprofen , Aleve , Advil , Motrin , or naproxen .  You may take Tylenol  for breakthrough pain. Recommend the use of ice or heat.  Apply ice for pain or swelling, heat for spasm or stiffness.  Apply for 20 minutes, remove for 1 hour, repeat as needed. Gentle stretching and range of motion exercises to help decrease your recovery time. Go to the emergency department if you experience loss of control of your bowel or bladder, worsening back pain, you become unable to walk, or other concerns. If your symptoms fail to improve, please follow-up with your primary care physician for further evaluation. Follow-up as needed.

## 2024-09-10 ENCOUNTER — Ambulatory Visit
Admission: EM | Admit: 2024-09-10 | Discharge: 2024-09-10 | Disposition: A | Discharge status: Home / Self Care | Attending: Family Medicine | Admitting: Family Medicine

## 2024-09-10 DIAGNOSIS — K047 Periapical abscess without sinus: Secondary | ICD-10-CM

## 2024-09-10 MED ORDER — CHLORHEXIDINE GLUCONATE 0.12 % MT SOLN: 15.0000 mL | Freq: Two times a day (BID) | OROMUCOSAL | 0 refills | Status: AC

## 2024-09-10 MED ORDER — LIDOCAINE VISCOUS HCL 2 % MT SOLN: 10.0000 mL | OROMUCOSAL | 0 refills | Status: DC | PRN

## 2024-09-10 MED ORDER — AMOXICILLIN-POT CLAVULANATE 875-125 MG PO TABS: 1.0000 | ORAL_TABLET | Freq: Two times a day (BID) | ORAL | 0 refills | Status: AC

## 2024-09-10 NOTE — ED Triage Notes (Signed)
 Pt reports she has left side dental pain that started this morning

## 2024-09-10 NOTE — ED Provider Notes (Signed)
 [facility]    CSN: [id number] Arrival date & time: 09/10/24  1524      History   Chief Complaint No chief complaint on file.   HPI Lauren Glover is a 58 y.o. female.   Pt reports she has left side dental pain that started this morning       Past Medical History:  Diagnosis Date   Arthritis    Chronic constipation    Diverticulitis of colon    GERD (gastroesophageal reflux disease)    Hiatal hernia    History of kidney stones    Menorrhagia    Uterine fibroid     Patient Active Problem List   Diagnosis Date Noted   Rheumatoid arthritis with rheumatoid factor 09/05/2016   Primary osteoarthritis of both knees 09/05/2016   Primary osteoarthritis of both hands 09/05/2016   Primary osteoarthritis of both feet 09/05/2016   Hand pain 09/04/2016   Gastroesophageal reflux 08/05/2016   History of kidney stones 08/05/2016   Asthma 08/05/2016   Arthralgia 08/02/2016   Vitamin D deficiency 08/02/2016   Fatigue 08/02/2016   Family history of polyps in the colon    Diverticulosis of colon without hemorrhage    Encounter for screening colonoscopy 08/17/2015   Constipation 08/17/2015    Past Surgical History:  Procedure Laterality Date   APPENDECTOMY     COLONOSCOPY N/A 09/11/2015   Procedure: COLONOSCOPY;  Surgeon: Lamar CHRISTELLA Hollingshead, MD;  Location: [facility];  Service: Endoscopy;  Laterality: N/A;   COLONOSCOPY N/A 02/21/2021   Procedure: COLONOSCOPY;  Surgeon: Hollingshead Lamar CHRISTELLA, MD;  Location: [facility];  Service: Endoscopy;  Laterality: N/A;  ASA II / PM   ESOPHAGOGASTRODUODENOSCOPY   11/10/2007   Dr. Rourk:Normal esophagus, hiatal hernia.  Small polypoid lesion in the cardia of uncertain significance, status post biopsy removal otherwise normal stomach. Benign fundic gland polyp    EXTRACORPOREAL SHOCK WAVE LITHOTRIPSY Left 04-30-2010   LAPAROSCOPIC APPENDECTOMY  07-04-2009   LAPAROSCOPIC CHOLECYSTECTOMY     POLYPECTOMY  02/21/2021   Procedure:  POLYPECTOMY;  Surgeon: Hollingshead Lamar CHRISTELLA, MD;  Location: [facility];  Service: Endoscopy;;   REMOVAL BENIGN RUMOR LEFT CALF      OB History   No obstetric history on file.      Home Medications    Prior to Admission medications  Medication Sig Start Date End Date Taking? Authorizing Provider  amoxicillin -clavulanate (AUGMENTIN) 875-125 MG tablet Take 1 tablet by mouth every 12 (twelve) hours. 09/10/24  Yes Stuart Vernell Norris, PA-C  chlorhexidine (PERIDEX) 0.12 % solution Use as directed 15 mLs in the mouth or throat 2 (two) times daily. 09/10/24  Yes Stuart Vernell Norris, PA-C  lidocaine (XYLOCAINE) 2 % solution Use as directed 10 mLs in the mouth or throat every 3 (three) hours as needed. 09/10/24  Yes Stuart Vernell Norris, PA-C  amLODipine  (NORVASC ) 2.5 MG tablet Take 1 tablet (2.5 mg total) by mouth daily. 12/25/21 01/24/22  Waylan Elsie PARAS, PA-C  Biotin w/ Vitamins C & E (HAIR/SKIN/NAILS PO) Take 3 capsules by mouth daily.    [provider]  brompheniramine-pseudoephedrine-DM 30-2-10 MG/5ML syrup Take 5 mLs by mouth 4 (four) times daily as needed. 01/11/23   Leath-Warren, Etta PARAS, NP  cetirizine  (ZYRTEC ) 10 MG tablet Take 1 tablet (10 mg total) by mouth daily. 01/11/23   Leath-Warren, Etta PARAS, NP  Cholecalciferol (VITAMIN D3) 125 MCG (5000 UT) CAPS Take 5,000 Units by mouth daily.    [provider]  ferrous sulfate 325 (65 FE) MG tablet Take 325 mg by mouth daily with breakfast.    [provider]  fluticasone  (FLONASE ) 50 MCG/ACT nasal spray Place 2 sprays into both nostrils daily. 01/11/23   Leath-Warren, Etta PARAS, NP  naproxen  (NAPROSYN ) 375 MG tablet Take 1 tablet (375 mg total) by mouth 2 (two) times daily. 12/20/23   Kommor, Madison, MD  promethazine -dextromethorphan  (PROMETHAZINE -DM) 6.25-15 MG/5ML syrup Take 5 mLs by mouth 4 (four) times daily as needed. 09/04/22   Stuart Vernell Norris, PA-C  Tetrahydrozoline HCl (REDNESS RELIEVER EYE  DROPS OP) Place 1 drop into both eyes daily as needed (redness).    [provider]  tiZANidine  (ZANAFLEX ) 4 MG tablet Take 1 tablet (4 mg total) by mouth every 8 (eight) hours as needed. 08/07/24   Leath-Warren, Etta PARAS, NP    Family History Family History  Problem Relation Age of Onset   Colon polyps Sister    High blood pressure Sister    High blood pressure Mother    High blood pressure Brother    Lupus Sister    Colon cancer Neg Hx     Social History Social History[1]   Allergies   Patient has no known allergies.   Review of Systems Review of Systems PER HPI  Physical Exam Triage Vital Signs ED Triage Vitals  Encounter Vitals Group     BP 09/10/24 1528 138/82     Girls Systolic BP Percentile --      Girls Diastolic BP Percentile --      Boys Systolic BP Percentile --      Boys Diastolic BP Percentile --      Pulse Rate 09/10/24 1528 92     Resp 09/10/24 1528 20     Temp 09/10/24 1528 (!) 97.4 F (36.3 C)     Temp Source 09/10/24 1528 Oral     SpO2 09/10/24 1528 97 %     Weight --      Height --      Head Circumference --      Peak Flow --      Pain Score 09/10/24 1529 5     Pain Loc --      Pain Education --      Exclude from Growth Chart --    No data found.  Updated Vital Signs BP 138/82 (BP Location: Right Arm)   Pulse 92   Temp (!) 97.4 F (36.3 C) (Oral)   Resp 20   LMP  (LMP Unknown)   SpO2 97%   Visual Acuity Right Eye Distance:   Left Eye Distance:   Bilateral Distance:    Right Eye Near:   Left Eye Near:    Bilateral Near:     Physical Exam Vitals and nursing note reviewed.  Constitutional:      Appearance: Normal appearance. She is not ill-appearing.  HENT:     Head: Atraumatic.     Nose: Nose normal.     Mouth/Throat:     Mouth: Mucous membranes are moist.     Comments: Left upper gingival erythema, edema Eyes:     Extraocular Movements: Extraocular movements intact.     Conjunctiva/sclera: Conjunctivae  normal.  Cardiovascular:     Rate and Rhythm: Normal rate.  Pulmonary:     Effort: Pulmonary effort is normal.  Musculoskeletal:        General: Normal range of motion.     Cervical back: Normal range of motion and neck  supple.  Skin:    General: Skin is warm and dry.  Neurological:     Mental Status: She is alert and oriented to person, place, and time.  Psychiatric:        Mood and Affect: Mood normal.        Thought Content: Thought content normal.        Judgment: Judgment normal.      UC Treatments / Results  Labs (all labs ordered are listed, but only abnormal results are displayed) Labs Reviewed - No data to display  EKG   Radiology No results found.  Procedures Procedures (including critical care time)  Medications Ordered in UC Medications - No data to display  Initial Impression / Assessment and Plan / UC Course  I have reviewed the triage vital signs and the nursing notes.  Pertinent labs & imaging results that were available during my care of the patient were reviewed by me and considered in my medical decision making (see chart for details).     Vital signs within normal limits, she is well-appearing in no acute distress.  She will Augmentin, Peridex, viscous lidocaine and close dental follow-up.  Return for worsening or unresolving symptoms.  Final Clinical Impressions(s) / UC Diagnoses   Final diagnoses:  Dental infection   Discharge Instructions   None    ED Prescriptions     Medication Sig Dispense Auth. Provider   amoxicillin -clavulanate (AUGMENTIN) 875-125 MG tablet Take 1 tablet by mouth every 12 (twelve) hours. 14 tablet Stuart Vernell Norris, PA-C   chlorhexidine (PERIDEX) 0.12 % solution Use as directed 15 mLs in the mouth or throat 2 (two) times daily. 120 mL Stuart Vernell Norris, PA-C   lidocaine (XYLOCAINE) 2 % solution Use as directed 10 mLs in the mouth or throat every 3 (three) hours as needed. 100 mL Stuart Vernell Norris,  [place]      PDMP not reviewed this encounter.    [1]  Social History Tobacco Use   Smoking status: Never   Smokeless tobacco: Never  Vaping Use   Vaping status: Never Used  Substance Use Topics   Alcohol use: No    Alcohol/week: 0.0 standard drinks of alcohol   Drug use: No     Stuart Vernell Norris, PA-C 09/10/24 1640

## 2024-09-12 ENCOUNTER — Encounter (HOSPITAL_COMMUNITY): Payer: Self-pay

## 2024-09-12 ENCOUNTER — Emergency Department (HOSPITAL_COMMUNITY)

## 2024-09-12 ENCOUNTER — Emergency Department (HOSPITAL_COMMUNITY)
Admission: EM | Admit: 2024-09-12 | Discharge: 2024-09-12 | Disposition: A | Discharge status: Home / Self Care | Attending: Emergency Medicine | Admitting: Emergency Medicine

## 2024-09-12 DIAGNOSIS — R1084 Generalized abdominal pain: Secondary | ICD-10-CM | POA: Diagnosis not present

## 2024-09-12 DIAGNOSIS — N200 Calculus of kidney: Secondary | ICD-10-CM

## 2024-09-12 DIAGNOSIS — R319 Hematuria, unspecified: Secondary | ICD-10-CM

## 2024-09-12 DIAGNOSIS — R1083 Colic: Secondary | ICD-10-CM

## 2024-09-12 DIAGNOSIS — R10A2 Flank pain, left side: Secondary | ICD-10-CM | POA: Diagnosis present

## 2024-09-12 DIAGNOSIS — R109 Unspecified abdominal pain: Secondary | ICD-10-CM | POA: Diagnosis present

## 2024-09-12 LAB — CBC WITH DIFFERENTIAL/PLATELET
Abs Immature Granulocytes: 0.01 K/uL (ref 0.00–0.07)
Basophils Absolute: 0 K/uL (ref 0.0–0.1)
Basophils Relative: 0 %
Eosinophils Absolute: 0.1 K/uL (ref 0.0–0.5)
Eosinophils Relative: 1 %
HCT: 44.4 % (ref 36.0–46.0)
Hemoglobin: 14.7 g/dL (ref 12.0–15.0)
Immature Granulocytes: 0 %
Lymphocytes Relative: 42 %
Lymphs Abs: 2.5 K/uL (ref 0.7–4.0)
MCH: 33.3 pg (ref 26.0–34.0)
MCHC: 33.1 g/dL (ref 30.0–36.0)
MCV: 100.5 fL — ABNORMAL HIGH (ref 80.0–100.0)
Monocytes Absolute: 0.6 K/uL (ref 0.1–1.0)
Monocytes Relative: 10 %
Neutro Abs: 2.7 K/uL (ref 1.7–7.7)
Neutrophils Relative %: 47 %
Platelets: 184 K/uL (ref 150–400)
RBC: 4.42 MIL/uL (ref 3.87–5.11)
RDW: 13.6 % (ref 11.5–15.5)
WBC: 6 K/uL (ref 4.0–10.5)
nRBC: 0 % (ref 0.0–0.2)

## 2024-09-12 LAB — COMPREHENSIVE METABOLIC PANEL WITH GFR
ALT: 15 U/L (ref 0–44)
AST: 30 U/L (ref 15–41)
Albumin: 4.5 g/dL (ref 3.5–5.0)
Alkaline Phosphatase: 143 U/L — ABNORMAL HIGH (ref 38–126)
Anion gap: 7 (ref 5–15)
BUN: 11 mg/dL (ref 6–20)
CO2: 28 mmol/L (ref 22–32)
Calcium: 9.6 mg/dL (ref 8.9–10.3)
Chloride: 104 mmol/L (ref 98–111)
Creatinine, Ser: 0.79 mg/dL (ref 0.44–1.00)
GFR, Estimated: >60 mL/min (ref >60)
Glucose, Bld: 98 mg/dL (ref 70–99)
Potassium: 4.2 mmol/L (ref 3.5–5.1)
Sodium: 139 mmol/L (ref 135–145)
Total Bilirubin: 0.5 mg/dL (ref 0.0–1.2)
Total Protein: 7.3 g/dL (ref 6.5–8.1)

## 2024-09-12 LAB — LIPASE, BLOOD: Lipase: 28 U/L (ref 11–51)

## 2024-09-12 LAB — URINALYSIS, ROUTINE W REFLEX MICROSCOPIC
Bacteria, UA: NONE SEEN
Bilirubin Urine: NEGATIVE
Glucose, UA: NEGATIVE mg/dL
Ketones, ur: NEGATIVE mg/dL
Leukocytes,Ua: NEGATIVE
Nitrite: NEGATIVE
Protein, ur: 30 mg/dL — AB
Specific Gravity, Urine: 1.023 (ref 1.005–1.030)
pH: 5 (ref 5.0–8.0)

## 2024-09-12 MED ORDER — DICYCLOMINE HCL 10 MG PO CAPS
20.0000 mg | ORAL_CAPSULE | Freq: Once | ORAL | Status: AC
  Administered 2024-09-12: 20 mg via ORAL
  Filled 2024-09-12: qty 2

## 2024-09-12 MED ORDER — DICYCLOMINE HCL 20 MG PO TABS: 20.0000 mg | ORAL_TABLET | Freq: Two times a day (BID) | ORAL | 0 refills | Status: DC

## 2024-09-12 MED ORDER — DICYCLOMINE HCL 20 MG PO TABS: 20.0000 mg | ORAL_TABLET | Freq: Two times a day (BID) | ORAL | 0 refills | Status: AC

## 2024-09-12 MED ORDER — SODIUM CHLORIDE 0.9 % IV BOLUS
1000.0000 mL | Freq: Once | INTRAVENOUS | Status: AC
  Administered 2024-09-12: 1000 mL via INTRAVENOUS

## 2024-09-12 MED ORDER — NAPROXEN 500 MG PO TABS: 500.0000 mg | ORAL_TABLET | Freq: Two times a day (BID) | ORAL | 0 refills | Status: DC

## 2024-09-12 NOTE — ED Triage Notes (Signed)
 Pt c/o R flank and RLQ pain and n/v starting this morning.  Pain score 5/10.  Hx of kidney stones.

## 2024-09-12 NOTE — Discharge Instructions (Signed)
 Dicyclomine  20 mg twice a day as needed for pain in the abdomen, cramping etc.  Zofran  as needed for nausea every 6 hours  Please take Naprosyn , 500mg  by mouth twice daily as needed for pain - this in an antiinflammatory medicine (NSAID) and is similar to ibuprofen  - many people feel that it is stronger than ibuprofen  and it is easier to take since it is a smaller pill.  Please use this only for 1 week - if your pain persists, you will need to follow up with your doctor in the office for ongoing guidance and pain control.  Thank you for allowing us  to treat you in the emergency department today.  After reviewing your examination and potential testing that was done it appears that you are safe to go home.  I would like for you to follow-up with your doctor within the next several days, have them obtain your records and follow-up with them to review all potential tests and results from your visit.  If you should develop severe or worsening symptoms return to the emergency department immediately

## 2024-09-12 NOTE — ED Triage Notes (Signed)
 Pt returns after being discharged earlier.  Sts the pain has returned.  Pt was not discharged with any medications. Pain score 6/10.

## 2024-09-12 NOTE — Discharge Instructions (Signed)
 Please follow-up closely with your primary care doctor on an outpatient basis.  Return to emergency department immediately for any new or worsening symptoms.

## 2024-09-12 NOTE — ED Provider Notes (Signed)
 [place] EMERGENCY DEPARTMENT AT [facility] Provider Note   CSN: [id number] Arrival date & time: 09/12/24  1242     Patient presents with: Flank Pain and Emesis   Lauren Glover is a 57 y.o. female.   Patient is a 58 year old female who presents emergency department with a chief complaint of left flank pain which has been ongoing since early this morning.  She notes that she does have a history of kidney stones.  She notes that she has taken Tylenol  for her pain which did greatly improve it.  She denies any dysuria or hematuria at this point.  She has had some associated nausea and vomiting.  She denies any active fever or chills.  She denies any dizziness, lightheadedness or syncope.  There has been no chest pain or shortness of breath.   Flank Pain  Emesis      Prior to Admission medications  Medication Sig Start Date End Date Taking? Authorizing Provider  amLODipine  (NORVASC ) 2.5 MG tablet Take 1 tablet (2.5 mg total) by mouth daily. 12/25/21 01/24/22  Waylan Elsie PARAS, PA-C  amoxicillin -clavulanate (AUGMENTIN ) 875-125 MG tablet Take 1 tablet by mouth every 12 (twelve) hours. 09/10/24   Stuart Vernell Norris, PA-C  Biotin w/ Vitamins C & E (HAIR/SKIN/NAILS PO) Take 3 capsules by mouth daily.    [provider]  brompheniramine-pseudoephedrine-DM 30-2-10 MG/5ML syrup Take 5 mLs by mouth 4 (four) times daily as needed. 01/11/23   Leath-Warren, Etta PARAS, NP  cetirizine  (ZYRTEC ) 10 MG tablet Take 1 tablet (10 mg total) by mouth daily. 01/11/23   Leath-Warren, Etta PARAS, NP  chlorhexidine  (PERIDEX ) 0.12 % solution Use as directed 15 mLs in the mouth or throat 2 (two) times daily. 09/10/24   Stuart Vernell Norris, PA-C  Cholecalciferol (VITAMIN D3) 125 MCG (5000 UT) CAPS Take 5,000 Units by mouth daily.    [provider]  ferrous sulfate 325 (65 FE) MG tablet Take 325 mg by mouth daily with breakfast.    [provider]  fluticasone   (FLONASE ) 50 MCG/ACT nasal spray Place 2 sprays into both nostrils daily. 01/11/23   Leath-Warren, Etta PARAS, NP  lidocaine  (XYLOCAINE ) 2 % solution Use as directed 10 mLs in the mouth or throat every 3 (three) hours as needed. 09/10/24   Stuart Vernell Norris, PA-C  naproxen  (NAPROSYN ) 375 MG tablet Take 1 tablet (375 mg total) by mouth 2 (two) times daily. 12/20/23   Kommor, Madison, MD  promethazine -dextromethorphan  (PROMETHAZINE -DM) 6.25-15 MG/5ML syrup Take 5 mLs by mouth 4 (four) times daily as needed. 09/04/22   Stuart Vernell Norris, PA-C  Tetrahydrozoline HCl (REDNESS RELIEVER EYE DROPS OP) Place 1 drop into both eyes daily as needed (redness).    [provider]  tiZANidine  (ZANAFLEX ) 4 MG tablet Take 1 tablet (4 mg total) by mouth every 8 (eight) hours as needed. 08/07/24   Leath-Warren, Etta PARAS, NP    Allergies: Patient has no known allergies.    Review of Systems  Gastrointestinal:  Positive for vomiting.  Genitourinary:  Positive for flank pain.  All other systems reviewed and are negative.   Updated Vital Signs BP (!) 152/75 (BP Location: Right Arm)   Pulse 61   Temp 97.7 F (36.5 C) (Oral)   Resp 18   Ht 5' 1 (1.549 m)   Wt 83.9 kg   LMP  (LMP Unknown)   SpO2 99%   BMI 34.96 kg/m   Physical Exam Vitals and nursing note reviewed.  Constitutional:  General: She is not in acute distress.    Appearance: Normal appearance. She is not ill-appearing.  HENT:     Head: Normocephalic and atraumatic.     Nose: Nose normal.     Mouth/Throat:     Mouth: Mucous membranes are moist.  Eyes:     Extraocular Movements: Extraocular movements intact.     Conjunctiva/sclera: Conjunctivae normal.     Pupils: Pupils are equal, round, and reactive to light.  Cardiovascular:     Rate and Rhythm: Normal rate and regular rhythm.     Pulses: Normal pulses.     Heart sounds: Normal heart sounds. No murmur heard.    No gallop.  Pulmonary:     Effort: Pulmonary  effort is normal. No respiratory distress.     Breath sounds: Normal breath sounds. No stridor. No wheezing, rhonchi or rales.  Abdominal:     General: Abdomen is flat. Bowel sounds are normal. There is no distension.     Palpations: Abdomen is soft.     Tenderness: There is no abdominal tenderness. There is no guarding.  Musculoskeletal:        General: Normal range of motion.     Cervical back: Normal range of motion and neck supple. No rigidity or tenderness.  Skin:    General: Skin is warm and dry.  Neurological:     General: No focal deficit present.     Mental Status: She is alert and oriented to person, place, and time. Mental status is at baseline.  Psychiatric:        Mood and Affect: Mood normal.        Behavior: Behavior normal.        Thought Content: Thought content normal.        Judgment: Judgment normal.     (all labs ordered are listed, but only abnormal results are displayed) Labs Reviewed  URINALYSIS, ROUTINE W REFLEX MICROSCOPIC  COMPREHENSIVE METABOLIC PANEL WITH GFR  LIPASE, BLOOD  CBC WITH DIFFERENTIAL/PLATELET    EKG: None  Radiology: No results found.   Procedures   Medications Ordered in the ED  sodium chloride  0.9 % bolus 1,000 mL (has no administration in time range)                                    Medical Decision Making Amount and/or Complexity of Data Reviewed Labs: ordered. Radiology: ordered.   This patient presents to the ED for concern of flank pain differential diagnosis includes pyelonephritis, kidney stone, pancreatitis, mesenteric ischemia, diverticulitis    Additional history obtained:  Additional history obtained from none External records from outside source obtained and reviewed including none   Lab Tests:  I Ordered, and personally interpreted labs.  The pertinent results include: No leukocytosis, no anemia, normal kidney function liver function, unremarkable electrolytes, urinalysis with large hemoglobin,  normal lipase   Imaging Studies ordered:  I ordered imaging studies including CT scan renal stone study I independently visualized and interpreted imaging which showed no ureteral stone, no other acute surgical process I agree with the radiologist interpretation   Medicines ordered and prescription drug management:  I ordered medication including IV fluids for flank pain Reevaluation of the patient after these medicines showed that the patient improved I have reviewed the patients home medicines and have made adjustments as needed   Problem List / ED Course:  Patient is doing well at this  time and is stable for discharge home.  Discussed with patient that CT scan only demonstrated nephrolithiasis with no indication for ureteral stone at this time.  Patient may have still passed a kidney stone recently as she does have associated hematuria.  She has no indication of urinary tract infection or pyelonephritis at this time.  No other acute surgical process was noted on CT scan of the abdomen pelvis.  Do not suspect any further emergent workup is warranted at this time.  Close follow-up with primary care doctor was discussed as well as strict turn precautions for any new or worsening symptoms.  Patient voiced understanding and had no additional questions.   Social Determinants of Health:  None        Final diagnoses:  None    ED Discharge Orders     None          Daralene Lonni JONETTA DEVONNA 09/12/24 1412    Suzette Pac, MD 09/13/24 1113

## 2024-09-12 NOTE — ED Provider Notes (Signed)
 [place] EMERGENCY DEPARTMENT AT [facility] Provider Note   CSN: [id number] Arrival date & time: 09/12/24  1732     Patient presents with: Follow-up, Flank Pain, and Abdominal Pain   Lauren Glover is a 58 y.o. female.    Flank Pain Associated symptoms include abdominal pain.  Abdominal Pain  This patient is a 58 year old female who had been here earlier today with a very complete workup for flank and abdominal discomfort including labs urinalysis and CT scan.  Blood studies were very reassuring, the patient was pain-free at discharge but when she went home she had some recurrent pain that again has resolved spontaneously by the time she gets her.  She states that the pain is a crampy aching pain that goes across her abdomen from the left side down, associated with some diaphoresis and then completely resolved.  She does have a history of prior kidney stones, the CT scan was reassuring that there was no ureterolithiasis or hydronephrosis.    Prior to Admission medications  Medication Sig Start Date End Date Taking? Authorizing Provider  dicyclomine  (BENTYL ) 20 MG tablet Take 1 tablet (20 mg total) by mouth 2 (two) times daily. 09/12/24  Yes Cleotilde Rogue, MD  naproxen  (NAPROSYN ) 500 MG tablet Take 1 tablet (500 mg total) by mouth 2 (two) times daily with a meal. 09/12/24  Yes Cleotilde Rogue, MD  amLODipine  (NORVASC ) 2.5 MG tablet Take 1 tablet (2.5 mg total) by mouth daily. 12/25/21 01/24/22  Waylan Elsie PARAS, PA-C  amoxicillin -clavulanate (AUGMENTIN ) 875-125 MG tablet Take 1 tablet by mouth every 12 (twelve) hours. 09/10/24   Stuart Vernell Norris, PA-C  Biotin w/ Vitamins C & E (HAIR/SKIN/NAILS PO) Take 3 capsules by mouth daily.    [provider]  brompheniramine-pseudoephedrine-DM 30-2-10 MG/5ML syrup Take 5 mLs by mouth 4 (four) times daily as needed. 01/11/23   Leath-Warren, Etta PARAS, NP  cetirizine  (ZYRTEC ) 10 MG tablet Take 1 tablet (10 mg total) by  mouth daily. 01/11/23   Leath-Warren, Etta PARAS, NP  chlorhexidine  (PERIDEX ) 0.12 % solution Use as directed 15 mLs in the mouth or throat 2 (two) times daily. 09/10/24   Stuart Vernell Norris, PA-C  Cholecalciferol (VITAMIN D3) 125 MCG (5000 UT) CAPS Take 5,000 Units by mouth daily.    [provider]  ferrous sulfate 325 (65 FE) MG tablet Take 325 mg by mouth daily with breakfast.    [provider]  fluticasone  (FLONASE ) 50 MCG/ACT nasal spray Place 2 sprays into both nostrils daily. 01/11/23   Leath-Warren, Etta PARAS, NP  lidocaine  (XYLOCAINE ) 2 % solution Use as directed 10 mLs in the mouth or throat every 3 (three) hours as needed. 09/10/24   Stuart Vernell Norris, PA-C  promethazine -dextromethorphan  (PROMETHAZINE -DM) 6.25-15 MG/5ML syrup Take 5 mLs by mouth 4 (four) times daily as needed. 09/04/22   Stuart Vernell Norris, PA-C  Tetrahydrozoline HCl (REDNESS RELIEVER EYE DROPS OP) Place 1 drop into both eyes daily as needed (redness).    [provider]  tiZANidine  (ZANAFLEX ) 4 MG tablet Take 1 tablet (4 mg total) by mouth every 8 (eight) hours as needed. 08/07/24   Leath-Warren, Etta PARAS, NP    Allergies: Patient has no known allergies.    Review of Systems  Gastrointestinal:  Positive for abdominal pain.  Genitourinary:  Positive for flank pain.  All other systems reviewed and are negative.   Updated Vital Signs BP (!) 166/94 (BP Location: Right Arm)   Pulse 68   Temp  98 F (36.7 C)   Resp 18   Ht 1.549 m (5' 1)   Wt 83.9 kg   LMP  (LMP Unknown)   SpO2 99%   BMI 34.96 kg/m   Physical Exam Vitals and nursing note reviewed.  Constitutional:      General: She is not in acute distress.    Appearance: She is well-developed.  HENT:     Head: Normocephalic and atraumatic.     Mouth/Throat:     Pharynx: No oropharyngeal exudate.  Eyes:     General: No scleral icterus.       Right eye: No discharge.        Left eye: No discharge.      Conjunctiva/sclera: Conjunctivae normal.     Pupils: Pupils are equal, round, and reactive to light.  Neck:     Thyroid: No thyromegaly.     Vascular: No JVD.  Cardiovascular:     Rate and Rhythm: Normal rate and regular rhythm.     Heart sounds: Normal heart sounds. No murmur heard.    No friction rub. No gallop.  Pulmonary:     Effort: Pulmonary effort is normal. No respiratory distress.     Breath sounds: Normal breath sounds. No wheezing or rales.  Abdominal:     General: Bowel sounds are normal. There is no distension.     Palpations: Abdomen is soft. There is no mass.     Tenderness: There is no abdominal tenderness.  Musculoskeletal:        General: No tenderness. Normal range of motion.     Cervical back: Normal range of motion and neck supple.  Lymphadenopathy:     Cervical: No cervical adenopathy.  Skin:    General: Skin is warm and dry.     Findings: No erythema or rash.  Neurological:     Mental Status: She is alert.     Coordination: Coordination normal.  Psychiatric:        Behavior: Behavior normal.     (all labs ordered are listed, but only abnormal results are displayed) Labs Reviewed - No data to display  EKG: None  Radiology: CT Renal Stone Study Result Date: 09/12/2024 CLINICAL DATA:  Right flank pain and right lower quadrant pain beginning this morning. Nausea and vomiting. Nephrolithiasis. EXAM: CT ABDOMEN AND PELVIS WITHOUT CONTRAST TECHNIQUE: Multidetector CT imaging of the abdomen and pelvis was performed following the standard protocol without IV contrast. RADIATION DOSE REDUCTION: This exam was performed according to the departmental dose-optimization program which includes automated exposure control, adjustment of the mA and/or kV according to patient size and/or use of iterative reconstruction technique. COMPARISON:  12/20/2023 FINDINGS: Lower chest: No acute findings. Hepatobiliary: No mass visualized on this unenhanced exam. Prior  cholecystectomy. No evidence of biliary obstruction. Pancreas: No mass or inflammatory process visualized on this unenhanced exam. Spleen:  Within normal limits in size. Adrenals/Urinary tract: 2 left renal calculi are seen, largest measuring 4 mm. No evidence of ureteral calculi or hydronephrosis. Bladder is empty. Stomach/Bowel: Tiny hiatal hernia noted. No evidence of obstruction, inflammatory process, or abnormal fluid collections. Right colonic diverticulosis is noted, without signs of diverticulitis. Vascular/Lymphatic: No pathologically enlarged lymph nodes identified. No evidence of abdominal aortic aneurysm. Reproductive: Tiny calcified uterine fibroid again noted. No other significant abnormality. Other:  None. Musculoskeletal:  No suspicious bone lesions identified. IMPRESSION: Left nephrolithiasis. No evidence of ureteral calculi, hydronephrosis, or other acute findings. Tiny hiatal hernia. Right colonic diverticulosis, without radiographic evidence of  diverticulitis. Tiny calcified uterine fibroid. Electronically Signed   By: Norleen DELENA Kil M.D.   On: 09/12/2024 13:50     Procedures   Medications Ordered in the ED  dicyclomine  (BENTYL ) capsule 20 mg (has no administration in time range)                                    Medical Decision Making Risk Prescription drug management.   The patient is very well-appearing, vitals reviewed with mild hypertension, I suspect she is having some intestinal colic, she had a very thorough workup earlier in the day and does not need any further testing at this time.  I offered her some dicyclomine  with a prescription for the same as an anti-inflammatory as well and she is agreeable to that.  She already has Zofran  at home, no signs of obstruction or pathological surgical process     Final diagnoses:  Intestinal colic    ED Discharge Orders          Ordered    dicyclomine  (BENTYL ) 20 MG tablet  2 times daily        09/12/24 1823     naproxen  (NAPROSYN ) 500 MG tablet  2 times daily with meals        09/12/24 1823               Cleotilde Rogue, MD 09/12/24 [phone]

## 2024-09-20 ENCOUNTER — Encounter (HOSPITAL_COMMUNITY): Payer: Self-pay

## 2024-09-20 ENCOUNTER — Other Ambulatory Visit: Payer: Self-pay

## 2024-09-20 ENCOUNTER — Emergency Department (HOSPITAL_COMMUNITY)
Admission: EM | Admit: 2024-09-20 | Discharge: 2024-09-20 | Disposition: A | Discharge status: Home / Self Care | Attending: Emergency Medicine | Admitting: Emergency Medicine

## 2024-09-20 DIAGNOSIS — N200 Calculus of kidney: Secondary | ICD-10-CM | POA: Diagnosis not present

## 2024-09-20 DIAGNOSIS — M545 Low back pain, unspecified: Secondary | ICD-10-CM | POA: Diagnosis present

## 2024-09-20 LAB — URINALYSIS, ROUTINE W REFLEX MICROSCOPIC
Bilirubin Urine: NEGATIVE
Glucose, UA: NEGATIVE mg/dL
Ketones, ur: NEGATIVE mg/dL
Nitrite: NEGATIVE
Protein, ur: NEGATIVE mg/dL
Specific Gravity, Urine: 1.02 (ref 1.005–1.030)
pH: 7 (ref 5.0–8.0)

## 2024-09-20 LAB — COMPREHENSIVE METABOLIC PANEL WITH GFR
ALT: 16 U/L (ref 0–44)
AST: 25 U/L (ref 15–41)
Albumin: 4.1 g/dL (ref 3.5–5.0)
Alkaline Phosphatase: 133 U/L — ABNORMAL HIGH (ref 38–126)
Anion gap: 11 (ref 5–15)
BUN: 11 mg/dL (ref 6–20)
CO2: 25 mmol/L (ref 22–32)
Calcium: 10.1 mg/dL (ref 8.9–10.3)
Chloride: 103 mmol/L (ref 98–111)
Creatinine, Ser: 0.83 mg/dL (ref 0.44–1.00)
GFR, Estimated: >60 mL/min
Glucose, Bld: 105 mg/dL — ABNORMAL HIGH (ref 70–99)
Potassium: 4 mmol/L (ref 3.5–5.1)
Sodium: 140 mmol/L (ref 135–145)
Total Bilirubin: 0.6 mg/dL (ref 0.0–1.2)
Total Protein: 7.3 g/dL (ref 6.5–8.1)

## 2024-09-20 LAB — CBC
HCT: 40.3 % (ref 36.0–46.0)
Hemoglobin: 13.5 g/dL (ref 12.0–15.0)
MCH: 33.3 pg (ref 26.0–34.0)
MCHC: 33.5 g/dL (ref 30.0–36.0)
MCV: 99.3 fL (ref 80.0–100.0)
Platelets: 261 K/uL (ref 150–400)
RBC: 4.06 MIL/uL (ref 3.87–5.11)
RDW: 13.2 % (ref 11.5–15.5)
WBC: 5.3 K/uL (ref 4.0–10.5)
nRBC: 0 % (ref 0.0–0.2)

## 2024-09-20 LAB — LIPASE, BLOOD: Lipase: 24 U/L (ref 11–51)

## 2024-09-20 LAB — URINALYSIS, MICROSCOPIC (REFLEX): RBC / HPF: >50 RBC/hpf (ref 0–5)

## 2024-09-20 MED ORDER — HYDROCODONE-ACETAMINOPHEN 5-325 MG PO TABS: 2.0000 | ORAL_TABLET | ORAL | 0 refills | Status: AC | PRN

## 2024-09-20 MED ORDER — SODIUM CHLORIDE 0.9 % IV BOLUS
1000.0000 mL | Freq: Once | INTRAVENOUS | Status: AC
  Administered 2024-09-20: 1000 mL via INTRAVENOUS

## 2024-09-20 MED ORDER — KETOROLAC TROMETHAMINE 15 MG/ML IJ SOLN
15.0000 mg | Freq: Once | INTRAMUSCULAR | Status: AC
  Administered 2024-09-20: 15 mg via INTRAVENOUS
  Filled 2024-09-20: qty 1

## 2024-09-20 MED ORDER — DICLOFENAC EPOLAMINE 1.3 % EX PTCH
1.0000 | MEDICATED_PATCH | Freq: Two times a day (BID) | CUTANEOUS | Status: DC
  Administered 2024-09-20: 1 via TRANSDERMAL
  Filled 2024-09-20 (×2): qty 1

## 2024-09-20 MED ORDER — METOCLOPRAMIDE HCL 5 MG/ML IJ SOLN
10.0000 mg | Freq: Once | INTRAMUSCULAR | Status: AC
  Administered 2024-09-20: 10 mg via INTRAVENOUS
  Filled 2024-09-20: qty 2

## 2024-09-20 NOTE — ED Provider Notes (Signed)
 " [place] EMERGENCY DEPARTMENT AT [facility] Provider Note   CSN: [id number] Arrival date & time: 09/20/24  9390     Patient presents with: Abdominal Pain and Flank Pain   Lauren Glover is a 58 y.o. female.   HPI Patient reports pain that radiates from the left side of her back to her left side. Patient states the pain comes and goes. States the pain began last Sunday morning. Patient was seen at [facility] and at the [place]. She has had a CT with and without contrast. She also reports bloating in her stomach and sweating at times. Denies trouble with urination. States she has had a kidney stones before. States her CT scans did not show any kidney stones   She focuses on pain that begins in her upper lumbar region radiating circumferentially to the left side, left lower quadrant.  She has been taking naproxen , intermittently.    Prior to Admission medications  Medication Sig Start Date End Date Taking? Authorizing Provider  HYDROcodone -acetaminophen  (NORCO/VICODIN) 5-325 MG tablet Take 2 tablets by mouth every 4 (four) hours as needed. 09/20/24  Yes Garrick Charleston, MD  amLODipine  (NORVASC ) 2.5 MG tablet Take 1 tablet (2.5 mg total) by mouth daily. 12/25/21 01/24/22  Waylan Elsie PARAS, PA-C  amoxicillin -clavulanate (AUGMENTIN ) 875-125 MG tablet Take 1 tablet by mouth every 12 (twelve) hours. 09/10/24   Stuart Vernell Norris, PA-C  Biotin w/ Vitamins C & E (HAIR/SKIN/NAILS PO) Take 3 capsules by mouth daily.    [provider]  brompheniramine-pseudoephedrine-DM 30-2-10 MG/5ML syrup Take 5 mLs by mouth 4 (four) times daily as needed. 01/11/23   Leath-Warren, Etta PARAS, NP  cetirizine  (ZYRTEC ) 10 MG tablet Take 1 tablet (10 mg total) by mouth daily. 01/11/23   Leath-Warren, Etta PARAS, NP  chlorhexidine  (PERIDEX ) 0.12 % solution Use as directed 15 mLs in the mouth or throat 2 (two) times daily. 09/10/24   Stuart Vernell Norris, PA-C  Cholecalciferol (VITAMIN D3) 125  MCG (5000 UT) CAPS Take 5,000 Units by mouth daily.    [provider]  dicyclomine  (BENTYL ) 20 MG tablet Take 1 tablet (20 mg total) by mouth 2 (two) times daily. 09/12/24   Cleotilde Rogue, MD  ferrous sulfate 325 (65 FE) MG tablet Take 325 mg by mouth daily with breakfast.    [provider]  fluticasone  (FLONASE ) 50 MCG/ACT nasal spray Place 2 sprays into both nostrils daily. 01/11/23   Leath-Warren, Etta PARAS, NP  Tetrahydrozoline HCl (REDNESS RELIEVER EYE DROPS OP) Place 1 drop into both eyes daily as needed (redness).    [provider]    Allergies: Patient has no known allergies.    Review of Systems  Updated Vital Signs BP (!) 131/104   Pulse 78   Temp 98.6 F (37 C) (Oral)   Resp 18   LMP  (LMP Unknown)   SpO2 100%   Physical Exam Vitals and nursing note reviewed.  Constitutional:      General: She is not in acute distress.    Appearance: She is well-developed.  HENT:     Head: Normocephalic and atraumatic.  Eyes:     Conjunctiva/sclera: Conjunctivae normal.  Cardiovascular:     Rate and Rhythm: Normal rate and regular rhythm.  Pulmonary:     Effort: Pulmonary effort is normal. No respiratory distress.     Breath sounds: Normal breath sounds. No stridor.  Abdominal:     General: There is no distension.     Tenderness:  There is abdominal tenderness. There is no guarding or rebound.  Musculoskeletal:       Arms:  Skin:    General: Skin is warm and dry.  Neurological:     Mental Status: She is alert and oriented to person, place, and time.     Cranial Nerves: No cranial nerve deficit.  Psychiatric:        Mood and Affect: Mood normal.     (all labs ordered are listed, but only abnormal results are displayed) Labs Reviewed  COMPREHENSIVE METABOLIC PANEL WITH GFR - Abnormal; Notable for the following components:      Result Value   Glucose, Bld 105 (*)    Alkaline Phosphatase 133 (*)    All other components within normal limits   URINALYSIS, ROUTINE W REFLEX MICROSCOPIC - Abnormal; Notable for the following components:   Hgb urine dipstick LARGE (*)    Leukocytes,Ua TRACE (*)    All other components within normal limits  URINALYSIS, MICROSCOPIC (REFLEX) - Abnormal; Notable for the following components:   Bacteria, UA RARE (*)    All other components within normal limits  LIPASE, BLOOD  CBC    EKG: None  Radiology: No results found.   Procedures   Medications Ordered in the ED  diclofenac  (FLECTOR ) 1.3 % 1 patch (1 patch Transdermal Patch Applied 09/20/24 0847)  sodium chloride  0.9 % bolus 1,000 mL (0 mLs Intravenous Stopped 09/20/24 1141)  metoCLOPramide  (REGLAN ) injection 10 mg (10 mg Intravenous Given 09/20/24 0822)  ketorolac  (TORADOL ) 15 MG/ML injection 15 mg (15 mg Intravenous Given 09/20/24 9178)                                    Medical Decision Making Adult female presents with ongoing abdominal pain, nausea, vomiting.  [facility], has had CT scan x 2, without obvious pathology.  She continues to have nausea, vomiting. Patient's symptoms may be secondary to colic versus gastritis versus lumbar radiculopathy given her description of pain radiating circumferentially.  Patient has no dysuria, no fever, reassuring for lower suspicion for pyelonephritis, colitis.   Amount and/or Complexity of Data Reviewed External Data Reviewed: notes.    Details: CT, prior ED notes Labs: ordered. Decision-making details documented in ED Course.  Risk Prescription drug management. Decision regarding hospitalization. Diagnosis or treatment significantly limited by social determinants of health.   12:29 PM Patient in no distress, awake, alert.  At bedside I reviewed the CT imaging from last week, including demonstration of the 2 renal stones. Today her evaluation is reassuring no evidence for bacteremia, sepsis.  There is hematuria  consistent with kidney stones either intrarenal, or possibly recently passed given currently she is pain-free, but has been experiencing intermittent flank pain over the past week. Given her improvement here, absence of hemodynamic instability, patient will be discharged to follow-up with urology either at the [facility] or here locally.     Final diagnoses:  Kidney stone    ED Discharge Orders          Ordered    HYDROcodone -acetaminophen  (NORCO/VICODIN) 5-325 MG tablet  Every 4 hours PRN        09/20/24 1229               Garrick Charleston, MD 09/20/24 1229  "

## 2024-09-20 NOTE — Discharge Instructions (Signed)
 As discussed, you are likely experiencing pain due to your kidney stones.  Typically this is intermittent, should improve as your body passes the kidney stones.  However, if you develop new, or concerning changes do not hesitate to return here.  Otherwise follow-up with our urology colleagues.  In addition to your prescribed pain medication use ibuprofen , 400 mg, 3 times daily for the next 3 days to decrease your pain.

## 2024-09-20 NOTE — ED Notes (Signed)
 Call bell reconnected to the outlet.  Informed and demonstrated to the patient that it is now working

## 2024-09-20 NOTE — ED Triage Notes (Signed)
 Patient reports pain that radiates from the left side of her back to her left side. Patient states the pain comes and goes. States the pain began last Sunday morning. Patient was seen at [facility] and at the [place]. She has had a CT with and without contrast. She also reports bloating in her stomach and sweating at times. Denies trouble with urination. States she has had a kidney stones before. States her CT scans did not show any kidney stones.

## 2024-09-24 ENCOUNTER — Emergency Department (HOSPITAL_COMMUNITY)
Admission: EM | Admit: 2024-09-24 | Discharge: 2024-09-24 | Disposition: A | Discharge status: Home / Self Care | Attending: Emergency Medicine | Admitting: Emergency Medicine

## 2024-09-24 ENCOUNTER — Encounter (HOSPITAL_COMMUNITY): Payer: Self-pay

## 2024-09-24 ENCOUNTER — Other Ambulatory Visit: Payer: Self-pay

## 2024-09-24 DIAGNOSIS — R1084 Generalized abdominal pain: Secondary | ICD-10-CM | POA: Diagnosis not present

## 2024-09-24 DIAGNOSIS — R109 Unspecified abdominal pain: Secondary | ICD-10-CM | POA: Diagnosis present

## 2024-09-24 LAB — URINALYSIS, ROUTINE W REFLEX MICROSCOPIC
Bacteria, UA: NONE SEEN
Bilirubin Urine: NEGATIVE
Glucose, UA: NEGATIVE mg/dL
Ketones, ur: 20 mg/dL — AB
Leukocytes,Ua: NEGATIVE
Nitrite: NEGATIVE
Protein, ur: 30 mg/dL — AB
Specific Gravity, Urine: 1.019 (ref 1.005–1.030)
pH: 5 (ref 5.0–8.0)

## 2024-09-24 LAB — CBC
HCT: 38.9 % (ref 36.0–46.0)
Hemoglobin: 12.9 g/dL (ref 12.0–15.0)
MCH: 32.9 pg (ref 26.0–34.0)
MCHC: 33.2 g/dL (ref 30.0–36.0)
MCV: 99.2 fL (ref 80.0–100.0)
Platelets: 275 K/uL (ref 150–400)
RBC: 3.92 MIL/uL (ref 3.87–5.11)
RDW: 12.9 % (ref 11.5–15.5)
WBC: 8.7 K/uL (ref 4.0–10.5)
nRBC: 0 % (ref 0.0–0.2)

## 2024-09-24 LAB — BASIC METABOLIC PANEL WITH GFR
Anion gap: 17 — ABNORMAL HIGH (ref 5–15)
BUN: 12 mg/dL (ref 6–20)
CO2: 22 mmol/L (ref 22–32)
Calcium: 10.1 mg/dL (ref 8.9–10.3)
Chloride: 99 mmol/L (ref 98–111)
Creatinine, Ser: 1.08 mg/dL — ABNORMAL HIGH (ref 0.44–1.00)
GFR, Estimated: 59 mL/min — ABNORMAL LOW
Glucose, Bld: 95 mg/dL (ref 70–99)
Potassium: 3.8 mmol/L (ref 3.5–5.1)
Sodium: 137 mmol/L (ref 135–145)

## 2024-09-24 MED ORDER — SODIUM CHLORIDE 0.9 % IV BOLUS
1000.0000 mL | Freq: Once | INTRAVENOUS | Status: AC
  Administered 2024-09-24: 1000 mL via INTRAVENOUS

## 2024-09-24 MED ORDER — FENTANYL CITRATE (PF) 100 MCG/2ML IJ SOLN
50.0000 ug | Freq: Once | INTRAMUSCULAR | Status: AC
  Administered 2024-09-24: 50 ug via INTRAVENOUS
  Filled 2024-09-24: qty 2

## 2024-09-24 NOTE — ED Triage Notes (Signed)
 Pt reports left side flank pain intermittently. Pt states she was seen over a week ago same symptoms reports did not show kidney stone. Pt reports painful urination.

## 2024-09-24 NOTE — Discharge Instructions (Signed)
 Follow-up urology as planned.  Also follow-up with GI.  Continue to take pain meds as needed.

## 2024-09-25 NOTE — ED Provider Notes (Signed)
 " [place] EMERGENCY DEPARTMENT AT [facility] Provider Note   CSN: [id number] Arrival date & time: 09/24/24  1637     Patient presents with: Flank Pain   Lauren Glover is a 58 y.o. female.    Flank Pain  Presents with flank pain/abdominal pain.  Her last few weeks to months.  Has been seen both in the ER here and at the [facility].  Has reported CAT scan at the [facility] with contrast recently.  Has had pain and states that pain recurred again today.  Has an appointment in 3 days with urology.  No fevers.  Has a previous cholecystectomy.    Past Medical History:  Diagnosis Date   Arthritis    Chronic constipation    Diverticulitis of colon    GERD (gastroesophageal reflux disease)    Hiatal hernia    History of kidney stones    Menorrhagia    Uterine fibroid     Prior to Admission medications  Medication Sig Start Date End Date Taking? Authorizing Provider  amLODipine  (NORVASC ) 2.5 MG tablet Take 1 tablet (2.5 mg total) by mouth daily. 12/25/21 01/24/22  Waylan Elsie PARAS, PA-C  amoxicillin -clavulanate (AUGMENTIN ) 875-125 MG tablet Take 1 tablet by mouth every 12 (twelve) hours. 09/10/24   Stuart Vernell Norris, PA-C  Biotin w/ Vitamins C & E (HAIR/SKIN/NAILS PO) Take 3 capsules by mouth daily.    [provider]  brompheniramine-pseudoephedrine-DM 30-2-10 MG/5ML syrup Take 5 mLs by mouth 4 (four) times daily as needed. 01/11/23   Leath-Warren, Etta PARAS, NP  cetirizine  (ZYRTEC ) 10 MG tablet Take 1 tablet (10 mg total) by mouth daily. 01/11/23   Leath-Warren, Etta PARAS, NP  chlorhexidine  (PERIDEX ) 0.12 % solution Use as directed 15 mLs in the mouth or throat 2 (two) times daily. 09/10/24   Stuart Vernell Norris, PA-C  Cholecalciferol (VITAMIN D3) 125 MCG (5000 UT) CAPS Take 5,000 Units by mouth daily.    [provider]  dicyclomine  (BENTYL ) 20 MG tablet Take 1 tablet (20 mg total) by mouth 2 (two) times daily. 09/12/24   Cleotilde Rogue, MD  ferrous  sulfate 325 (65 FE) MG tablet Take 325 mg by mouth daily with breakfast.    [provider]  fluticasone  (FLONASE ) 50 MCG/ACT nasal spray Place 2 sprays into both nostrils daily. 01/11/23   Leath-Warren, Etta PARAS, NP  HYDROcodone -acetaminophen  (NORCO/VICODIN) 5-325 MG tablet Take 2 tablets by mouth every 4 (four) hours as needed. 09/20/24   Garrick Charleston, MD  Tetrahydrozoline HCl (REDNESS RELIEVER EYE DROPS OP) Place 1 drop into both eyes daily as needed (redness).    [provider]    Allergies: Patient has no known allergies.    Review of Systems  Genitourinary:  Positive for flank pain.    Updated Vital Signs BP (!) 140/88 (BP Location: Right Arm)   Pulse 66   Temp 98.1 F (36.7 C) (Oral)   Resp 17   Ht 5' 1 (1.549 m)   Wt 83.9 kg   LMP  (LMP Unknown)   SpO2 98%   BMI 34.96 kg/m   Physical Exam Vitals and nursing note reviewed.  Abdominal:     Tenderness: There is abdominal tenderness.     Comments: Upper abdominal tenderness no rebound or guarding.  No hernia palpated.  Genitourinary:    Comments: No CVA tenderness. Musculoskeletal:     Cervical back: Neck supple.     (all labs ordered are listed, but only abnormal results are displayed)  Labs Reviewed  URINALYSIS, ROUTINE W REFLEX MICROSCOPIC - Abnormal; Notable for the following components:      Result Value   APPearance HAZY (*)    Hgb urine dipstick MODERATE (*)    Ketones, ur 20 (*)    Protein, ur 30 (*)    All other components within normal limits  BASIC METABOLIC PANEL WITH GFR - Abnormal; Notable for the following components:   Creatinine, Ser 1.08 (*)    GFR, Estimated 59 (*)    Anion gap 17 (*)    All other components within normal limits  CBC    EKG: None  Radiology: No results found.   Procedures   Medications Ordered in the ED  sodium chloride  0.9 % bolus 1,000 mL (0 mLs Intravenous Stopped 09/24/24 2212)  fentaNYL  (SUBLIMAZE ) injection 50 mcg (50 mcg Intravenous  Given 09/24/24 2001)                                    Medical Decision Making Amount and/or Complexity of Data Reviewed Labs: ordered.  Risk Prescription drug management.   Patient with abdominal pain.  Has had recent workup for same.  Reviewed CT scan from the [facility] on the patient's phone.  Reviewed notes from here.  2 CT scans ago did show potential obstructing kidney stone although no definite obstructing stone seen.  Stones in the kidney.  However on repeat scan did not show it.  Reviewed CT scan from [place] and showed similar findings but also potentially mildly dilated common bile duct also some potential irritation of pancreatic duct.  Has had AST and ALT Normal.  Alk Phos Again Slightly above Normal Previously.  Lipase Also Normal.  -Follow-up with GI potentially and also with urology.  Do not think we need to repeat scan today.  Feeling somewhat better after treatment.  Some mild dehydration on blood work.  Will discharge home      Final diagnoses:  Generalized abdominal pain    ED Discharge Orders     None          Patsey Lot, MD 09/25/24 0019  "

## 2024-11-03 ENCOUNTER — Ambulatory Visit: Admitting: Gastroenterology
